# Patient Record
Sex: Male | Born: 1965 | Race: Black or African American | Hispanic: No | Marital: Single | Smoking: Current every day smoker
Health system: Southern US, Community
[De-identification: ages and names within clinical notes are randomized; demographics above are authoritative.]

## PROBLEM LIST (undated history)

## (undated) DIAGNOSIS — I10 Essential (primary) hypertension: Secondary | ICD-10-CM

---

## 2017-07-10 ENCOUNTER — Encounter: Payer: Self-pay | Admitting: Emergency Medicine

## 2017-07-10 ENCOUNTER — Emergency Department
Admission: EM | Admit: 2017-07-10 | Discharge: 2017-07-11 | Disposition: A | Discharge status: Home / Self Care | Payer: Self-pay | Attending: Emergency Medicine | Admitting: Emergency Medicine

## 2017-07-10 DIAGNOSIS — R519 Headache, unspecified: Secondary | ICD-10-CM

## 2017-07-10 DIAGNOSIS — I10 Essential (primary) hypertension: Secondary | ICD-10-CM | POA: Insufficient documentation

## 2017-07-10 DIAGNOSIS — F17210 Nicotine dependence, cigarettes, uncomplicated: Secondary | ICD-10-CM | POA: Insufficient documentation

## 2017-07-10 DIAGNOSIS — R51 Headache: Secondary | ICD-10-CM

## 2017-07-10 HISTORY — DX: Essential (primary) hypertension: I10

## 2017-07-10 NOTE — ED Notes (Signed)
Pt updated on delay by scott, ed tech.

## 2017-07-10 NOTE — ED Notes (Signed)
Report from sonja, rn.  

## 2017-07-10 NOTE — ED Triage Notes (Signed)
Pt arrives ambulatory to triage with c/o headache x 1 1/2 week. Pt states that he has taken multiple Ibuprofen with no relief. Pt denies trauma of any kind. Pt is in NAD.

## 2017-07-11 LAB — BASIC METABOLIC PANEL
Anion gap: 9 (ref 5–15)
BUN: 13 mg/dL (ref 6–20)
CO2: 27 mmol/L (ref 22–32)
CREATININE: 1.26 mg/dL — AB (ref 0.61–1.24)
Calcium: 9.5 mg/dL (ref 8.9–10.3)
Chloride: 103 mmol/L (ref 101–111)
GFR calc Af Amer: >60 mL/min (ref >60)
Glucose, Bld: 99 mg/dL (ref 65–99)
POTASSIUM: 3.1 mmol/L — AB (ref 3.5–5.1)
SODIUM: 139 mmol/L (ref 135–145)

## 2017-07-11 MED ORDER — HYDROCHLOROTHIAZIDE 12.5 MG PO CAPS
12.5000 mg | ORAL_CAPSULE | ORAL | Status: AC
  Administered 2017-07-11: 12.5 mg via ORAL
  Filled 2017-07-11: qty 1

## 2017-07-11 MED ORDER — BUTALBITAL-APAP-CAFFEINE 50-325-40 MG PO TABS
2.0000 | ORAL_TABLET | ORAL | Status: AC
  Administered 2017-07-11: 2 via ORAL
  Filled 2017-07-11: qty 2

## 2017-07-11 MED ORDER — HYDROCHLOROTHIAZIDE 12.5 MG PO CAPS: 12.5000 mg | ORAL_CAPSULE | Freq: Every day | ORAL | 3 refills | Status: DC

## 2017-07-11 NOTE — ED Notes (Signed)
Pt not in room.

## 2017-07-11 NOTE — ED Provider Notes (Signed)
[facility] Emergency Department Provider Note  ____________________________________________   First MD Initiated Contact with Patient 07/10/17 2352     (approximate)  I have reviewed the triage vital signs and the nursing notes.   HISTORY  Chief Complaint Headache    HPI Darren Gibson is a 51 y.o. male Who is generally healthy and active with only a history of hypertension who presents for evaluation of intermittent posterior headache over the last week and a half.  Nothing in particular makes his symptoms better or worse.  He has had no injury or head trauma.  He states that his headaches will come on gradually, lasts for hours or even into the next day, and they gradually get better.  Ibuprofen helped a little bit over the last week but not consistently.  He describes them as moderate.  He had an episode of feeling dizzy or lightheaded earlier today while he was at work and his boss told him to get checked out.  He denies fever/chills, neck pain, nausea, vomiting, chest pain, abdominal pain, dysuria, and changes in urinary habits.  Of note, the patient states that he had headaches like this in the past when he was diagnosed with high blood pressure.  He started on HCTZ and the headaches went away.  He has not been taking medicine for about a year and does not have a primary care doctor.  He did not continue on the medicine because he thought it would be too expensive and was unaware [facility].   Past Medical History:  Diagnosis Date  . Hypertension     There are no active problems to display for this patient.   History reviewed. No pertinent surgical history.  Prior to Admission medications   Medication Sig Start Date End Date Taking? Authorizing Provider  hydrochlorothiazide (MICROZIDE) 12.5 MG capsule Take 1 capsule (12.5 mg total) by mouth daily. 07/11/17   Loleta RoseForbach, Rozetta Stumpp, MD    Allergies Codeine and Percocet  [oxycodone-acetaminophen]  No family history on file.  Social History Social History  Substance Use Topics  . Smoking status: Current Every Day Smoker    Packs/day: 1.00    Types: Cigarettes  . Smokeless tobacco: Never Used  . Alcohol use No    Review of Systems Constitutional: No fever/chills Eyes: No visual changes. ENT: No sore throat. Cardiovascular: Denies chest pain. Respiratory: Denies shortness of breath. Gastrointestinal: No abdominal pain.  No nausea, no vomiting.  No diarrhea.  No constipation. Genitourinary: Negative for dysuria. Musculoskeletal: Negative for neck pain.  Negative for back pain. Integumentary: Negative for rash. Neurological: Intermittent posterior headaches for 1.5+ weeks.  No focal numbness/weakness   ____________________________________________   PHYSICAL EXAM:  VITAL SIGNS: ED Triage Vitals  Enc Vitals Group     BP 07/10/17 2200 (!) 176/100     Pulse Rate 07/10/17 2200 85     Resp 07/10/17 2200 18     Temp 07/10/17 2200 98.2 F (36.8 C)     Temp Source 07/10/17 2200 Oral     SpO2 07/10/17 2200 99 %     Weight 07/10/17 2200 89.8 kg (198 lb)     Height 07/10/17 2200 1.829 m (6')     Head Circumference --      Peak Flow --      Pain Score 07/10/17 2159 6     Pain Loc --      Pain Edu? --      Excl.  in GC? --     Constitutional: Alert and oriented. Well appearing and in no acute distress. Eyes: Conjunctivae are normal. PERRL. EOMI. Head: Atraumatic. Nose: No congestion/rhinnorhea. Mouth/Throat: Mucous membranes are moist. Neck: No stridor.  No meningeal signs.   Cardiovascular: Normal rate, regular rhythm. Good peripheral circulation. Grossly normal heart sounds. Respiratory: Normal respiratory effort.  No retractions. Lungs CTAB. Gastrointestinal: Soft and nontender. No distention.  Musculoskeletal: No lower extremity tenderness nor edema. No gross deformities of extremities. Neurologic:  Normal speech and language. No gross  focal neurologic deficits are appreciated.  Skin:  Skin is warm, dry and intact. No rash noted. Psychiatric: Mood and affect are normal. Speech and behavior are normal.  ____________________________________________   LABS (all labs ordered are listed, but only abnormal results are displayed)  Labs Reviewed  BASIC METABOLIC PANEL - Abnormal; Notable for the following:       Result Value   Potassium 3.1 (*)    Creatinine, Ser 1.26 (*)    All other components within normal limits   ____________________________________________  EKG  None - EKG not ordered by ED physician ____________________________________________  RADIOLOGY   No results found.  ____________________________________________   PROCEDURES  Critical Care performed: No   Procedure(s) performed:   Procedures   ____________________________________________   INITIAL IMPRESSION / ASSESSMENT AND PLAN / ED COURSE  As part of my medical decision making, I reviewed the following data within the electronic MEDICAL RECORD NUMBER Nursing notes reviewed and incorporated    Differential diagnosis includes, but is not limited to, intracranial hemorrhage, meningitis/encephalitis, previous head trauma, cavernous venous thrombosis, tension headache, temporal arteritis, migraine or migraine equivalent, idiopathic intracranial hypertension, and non-specific headache.  however, the patient is very well-appearing and in no acute distress with normal vital signs except for hypertension including a diastolic of about 100.  Given the duration of his symptoms and his reassuring physical exam as well as the overall history, I strongly doubt acute intracranial pathology that would be visible on any sort of imaging.  I discussed with him various options including CT scans, lab work, etc. I also gave him the option of starting back up on his blood pressure medicine.  He would prefer to do that and try some Fioricet.  I will check some  baseline labs but the patient wants to go home and I do not feel he needs to stay for the results.  I am starting him on 12.5 mg of HCTZ daily and encouraged him strongly to follow up with an outpatient doctor and I gave him a phone number to call to try and find a PCP.  I gave my usual and customary return precautions.  He understands and agrees with plan.  Clinical Course as of Jul 11 325  Sat Jul 11, 2017  1610 Lab work notable for a decreased potassium and very slightly increased creatinine but a normal GFR.  Of note, I was told after the fact that the patient eloped after the blood draw and before he could be given his paperwork including his prescription.  [CF]    Clinical Course User Index [CF] Loleta Rose, MD    ____________________________________________  FINAL CLINICAL IMPRESSION(S) / ED DIAGNOSES  Final diagnoses:  Nonintractable episodic headache, unspecified headache type  Essential hypertension     MEDICATIONS GIVEN DURING THIS VISIT:  Medications  hydrochlorothiazide (MICROZIDE) capsule 12.5 mg (12.5 mg Oral Given 07/11/17 0012)  butalbital-acetaminophen-caffeine (FIORICET, ESGIC) 50-325-40 MG per tablet 2 tablet (2 tablets Oral Given 07/11/17  0013)     NEW OUTPATIENT MEDICATIONS STARTED DURING THIS VISIT:  Discharge Medication List as of 07/11/2017 12:27 AM    START taking these medications   Details  hydrochlorothiazide (MICROZIDE) 12.5 MG capsule Take 1 capsule (12.5 mg total) by mouth daily., Starting Sat 07/11/2017, Print        Discharge Medication List as of 07/11/2017 12:27 AM      Discharge Medication List as of 07/11/2017 12:27 AM       Note:  This document was prepared using Dragon voice recognition software and may include unintentional dictation errors.    Loleta Rose, MD 07/11/17 [phone]

## 2017-07-11 NOTE — ED Notes (Signed)
Pt not in room per registration.

## 2017-07-11 NOTE — ED Notes (Signed)
Pt states has had a posterior skull headache for over one week. Pt states he is supposed to take HCTZ for HTN but has not taken. Pt denies nausea, vomiting. Pt appears in no acute distress. Skin normal color warm and dry. resps unlabored. Pt moving all extremities and able to bend neck without difficulty.

## 2017-07-11 NOTE — Discharge Instructions (Signed)
You have been seen in the Emergency Department (ED) for a headache.  Please use Tylenol or Motrin as needed for symptoms, but only as written on the box. Because we think that the headache is related to your blood pressure, encourage you to fill your prescription [facility] at [facility]Z 12.5 mg by mouth and follow up at the number propvided to establish a primary care doctor as soon as possible.  Call your doctor or return to the ED if you have a worsening headache, sudden and severe headache, confusion, slurred speech, facial droop, weakness or numbness in any arm or leg, extreme fatigue, vision problems, or other symptoms that concern you.

## 2017-09-26 ENCOUNTER — Emergency Department: Payer: Self-pay

## 2017-09-26 ENCOUNTER — Encounter: Payer: Self-pay | Admitting: Emergency Medicine

## 2017-09-26 DIAGNOSIS — Z760 Encounter for issue of repeat prescription: Secondary | ICD-10-CM | POA: Insufficient documentation

## 2017-09-26 DIAGNOSIS — R55 Syncope and collapse: Secondary | ICD-10-CM | POA: Insufficient documentation

## 2017-09-26 DIAGNOSIS — F17210 Nicotine dependence, cigarettes, uncomplicated: Secondary | ICD-10-CM | POA: Insufficient documentation

## 2017-09-26 DIAGNOSIS — I10 Essential (primary) hypertension: Secondary | ICD-10-CM | POA: Insufficient documentation

## 2017-09-26 LAB — BASIC METABOLIC PANEL
ANION GAP: 8 (ref 5–15)
BUN: 19 mg/dL (ref 6–20)
CHLORIDE: 105 mmol/L (ref 101–111)
CO2: 27 mmol/L (ref 22–32)
Calcium: 8.9 mg/dL (ref 8.9–10.3)
Creatinine, Ser: 1.34 mg/dL — ABNORMAL HIGH (ref 0.61–1.24)
GFR calc Af Amer: >60 mL/min (ref >60)
GFR, EST NON AFRICAN AMERICAN: 60 mL/min — AB (ref >60)
Glucose, Bld: 81 mg/dL (ref 65–99)
POTASSIUM: 3.5 mmol/L (ref 3.5–5.1)
Sodium: 140 mmol/L (ref 135–145)

## 2017-09-26 LAB — URINALYSIS, COMPLETE (UACMP) WITH MICROSCOPIC
BACTERIA UA: NONE SEEN
Bilirubin Urine: NEGATIVE
Glucose, UA: NEGATIVE mg/dL
Hgb urine dipstick: NEGATIVE
KETONES UR: NEGATIVE mg/dL
Leukocytes, UA: NEGATIVE
NITRITE: NEGATIVE
PROTEIN: NEGATIVE mg/dL
Specific Gravity, Urine: 1.015 (ref 1.005–1.030)
pH: 6 (ref 5.0–8.0)

## 2017-09-26 LAB — CBC
HEMATOCRIT: 51 % (ref 40.0–52.0)
HEMOGLOBIN: 17 g/dL (ref 13.0–18.0)
MCH: 31.8 pg (ref 26.0–34.0)
MCHC: 33.3 g/dL (ref 32.0–36.0)
MCV: 95.3 fL (ref 80.0–100.0)
Platelets: 298 10*3/uL (ref 150–440)
RBC: 5.35 MIL/uL (ref 4.40–5.90)
RDW: 12.8 % (ref 11.5–14.5)
WBC: 14.6 10*3/uL — ABNORMAL HIGH (ref 3.8–10.6)

## 2017-09-26 NOTE — ED Triage Notes (Signed)
Patient states that about 2 hours ago he was at work and started feeling weak, dizzy and short of breath. Patient states that he did donate plasma today.

## 2017-09-27 ENCOUNTER — Emergency Department
Admission: EM | Admit: 2017-09-27 | Discharge: 2017-09-27 | Disposition: A | Discharge status: Home / Self Care | Payer: Self-pay | Attending: Emergency Medicine | Admitting: Emergency Medicine

## 2017-09-27 DIAGNOSIS — I10 Essential (primary) hypertension: Secondary | ICD-10-CM

## 2017-09-27 DIAGNOSIS — Z760 Encounter for issue of repeat prescription: Secondary | ICD-10-CM

## 2017-09-27 DIAGNOSIS — R55 Syncope and collapse: Secondary | ICD-10-CM

## 2017-09-27 LAB — TROPONIN I: Troponin I: <0.03 ng/mL (ref <0.03)

## 2017-09-27 MED ORDER — HYDROCHLOROTHIAZIDE 12.5 MG PO CAPS: 12.5000 mg | ORAL_CAPSULE | Freq: Every day | ORAL | 0 refills | Status: DC

## 2017-09-27 NOTE — ED Provider Notes (Signed)
[facility] Emergency Department Provider Note  ____________________________________________   First MD Initiated Contact with Patient 09/27/17 0149     (approximate)  I have reviewed the triage vital signs and the nursing notes.   HISTORY  Chief Complaint Dizziness; Shortness of Breath; and Weakness   HPI Darren Gibson is a 52 y.o. male who self presents to emergency department with lightheadedness, nausea, warmth, and nearly passing out that happened 2 hours prior to arrival.  His symptoms began while at work when he was standing up.  The past after he laid down.  Several hours prior to this event he donated plasma for 3 hours.  He has no family history of sudden cardiac death.  He had no antecedent chest pain or palpitations.  He did not hit his head.  He feels well now.  His symptoms began suddenly passed quickly.  They are worsened by standing up and improved with rest.  Past Medical History:  Diagnosis Date  . Hypertension     There are no active problems to display for this patient.   History reviewed. No pertinent surgical history.  Prior to Admission medications   Medication Sig Start Date End Date Taking? Authorizing Provider  hydrochlorothiazide (MICROZIDE) 12.5 MG capsule Take 1 capsule (12.5 mg total) by mouth daily. 09/27/17   Merrily Brittle, MD    Allergies Codeine and Percocet [oxycodone-acetaminophen]  No family history on file.  Social History Social History   Tobacco Use  . Smoking status: Current Every Day Smoker    Packs/day: 1.00    Types: Cigarettes  . Smokeless tobacco: Never Used  Substance Use Topics  . Alcohol use: No  . Drug use: Yes    Types: Marijuana    Review of Systems Constitutional: No fever/chills Eyes: No visual changes. ENT: No sore throat. Cardiovascular: Denies chest pain. Respiratory: Denies shortness of breath. Gastrointestinal: No abdominal pain.  Positive for nausea, no vomiting.  No  diarrhea.  No constipation. Genitourinary: Negative for dysuria. Musculoskeletal: Negative for back pain. Skin: Negative for rash. Neurological: Negative for headaches, focal weakness or numbness.   ____________________________________________   PHYSICAL EXAM:  VITAL SIGNS: ED Triage Vitals [09/26/17 2149]  Enc Vitals Group     BP      Pulse      Resp      Temp      Temp src      SpO2      Weight 195 lb (88.5 kg)     Height 6' (1.829 m)     Head Circumference      Peak Flow      Pain Score      Pain Loc      Pain Edu?      Excl. in GC?     Constitutional: Alert and oriented x4 well-appearing nontoxic no diaphoresis speaks full clear sentences Eyes: PERRL EOMI. Head: Atraumatic. Nose: No congestion/rhinnorhea. Mouth/Throat: No trismus Neck: No stridor.   Cardiovascular: Normal rate, regular rhythm. Grossly normal heart sounds.  Good peripheral circulation. Respiratory: Normal respiratory effort.  No retractions. Lungs CTAB and moving good air Gastrointestinal: Soft nontender Musculoskeletal: No lower extremity edema   Neurologic:  Normal speech and language. No gross focal neurologic deficits are appreciated. Skin:  Skin is warm, dry and intact. No rash noted. Psychiatric: Mood and affect are normal. Speech and behavior are normal.    ____________________________________________   DIFFERENTIAL includes but not limited to  Cardiogenic syncope, vasovagal syncope, dehydration ____________________________________________  LABS (all labs ordered are listed, but only abnormal results are displayed)  Labs Reviewed  BASIC METABOLIC PANEL - Abnormal; Notable for the following components:      Result Value   Creatinine, Ser 1.34 (*)    GFR calc non Af Amer 60 (*)    All other components within normal limits  CBC - Abnormal; Notable for the following components:   WBC 14.6 (*)    All other components within normal limits  URINALYSIS, COMPLETE (UACMP) WITH  MICROSCOPIC - Abnormal; Notable for the following components:   Color, Urine YELLOW (*)    APPearance CLEAR (*)    Squamous Epithelial / LPF 0-5 (*)    All other components within normal limits  TROPONIN I    Lab work reviewed by me with no acute disease Elevated white count is nonspecific and likely secondary to stress __________________________________________  EKG  ED ECG REPORT I, Merrily BrittleNeil Morrison Masser, the attending physician, personally viewed and interpreted this ECG.  Date: 09/27/2017 EKG Time:  Rate: 112 Rhythm: Sinus tachycardia QRS Axis: normal Intervals: normal ST/T Wave abnormalities: normal Narrative Interpretation: no evidence of acute ischemia  ____________________________________________  RADIOLOGY  Chest x-ray reviewed by me with no acute disease ____________________________________________   PROCEDURES  Procedure(s) performed: no  Procedures  Critical Care performed: no  Observation: no ____________________________________________   INITIAL IMPRESSION / ASSESSMENT AND PLAN / ED COURSE  Pertinent labs & imaging results that were available during my care of the patient were reviewed by me and considered in my medical decision making (see chart for details).  The patient arrives hemodynamically stable and very well-appearing.  His EKG has no signs concerning for cardiogenic syncope.  He likely had a vasovagal episode secondary to dehydration from donating plasma.  On further questioning the patient does say that he is primarily here to get a work note because he nearly passed out at work.  He is also requesting referral to primary care physician and a refill of his hydrochlorothiazide.  At this point the patient is medically stable for outpatient management verbalizes understanding and agreement with the plan.      ____________________________________________   FINAL CLINICAL IMPRESSION(S) / ED DIAGNOSES  Final diagnoses:  Near syncope    Hypertension, unspecified type  Medication refill      NEW MEDICATIONS STARTED DURING THIS VISIT:  This SmartLink is deprecated. Use AVSMEDLIST instead to display the medication list for a patient.   Note:  This document was prepared using Dragon voice recognition software and may include unintentional dictation errors.     Merrily Brittleifenbark, Cabela Pacifico, MD 09/27/17 [phone]

## 2017-09-27 NOTE — Discharge Instructions (Signed)
Please make sure you remain well-hydrated and restart your home blood pressure medication.  It is critically important that you establish care with a primary care physician this coming week for reevaluation.  Return to the emergency department sooner for any concerns whatsoever.  It was a pleasure to take care of you today, and thank you for coming to our emergency department.  If you have any questions or concerns before leaving please ask the nurse to grab me and I'm more than happy to go through your aftercare instructions again.  If you were prescribed any opioid pain medication today such as Norco, Vicodin, Percocet, morphine, hydrocodone, or oxycodone please make sure you do not drive when you are taking this medication as it can alter your ability to drive safely.  If you have any concerns once you are home that you are not improving or are in fact getting worse before you can make it to your follow-up appointment, please do not hesitate to call 911 and come back for further evaluation.  Merrily BrittleNeil Adalid Beckmann, MD  Results for orders placed or performed during the hospital encounter of 09/27/17  Basic metabolic panel  Result Value Ref Range   Sodium 140 135 - 145 mmol/L   Potassium 3.5 3.5 - 5.1 mmol/L   Chloride 105 101 - 111 mmol/L   CO2 27 22 - 32 mmol/L   Glucose, Bld 81 65 - 99 mg/dL   BUN 19 6 - 20 mg/dL   Creatinine, Ser 5.781.34 (H) 0.61 - 1.24 mg/dL   Calcium 8.9 8.9 - 46.910.3 mg/dL   GFR calc non Af Amer 60 (L) >60 mL/min   GFR calc Af Amer >60 >60 mL/min   Anion gap 8 5 - 15  CBC  Result Value Ref Range   WBC 14.6 (H) 3.8 - 10.6 K/uL   RBC 5.35 4.40 - 5.90 MIL/uL   Hemoglobin 17.0 13.0 - 18.0 g/dL   HCT 62.951.0 52.840.0 - 41.352.0 %   MCV 95.3 80.0 - 100.0 fL   MCH 31.8 26.0 - 34.0 pg   MCHC 33.3 32.0 - 36.0 g/dL   RDW 24.412.8 01.011.5 - 27.214.5 %   Platelets 298 150 - 440 K/uL  Urinalysis, Complete w Microscopic  Result Value Ref Range   Color, Urine YELLOW (A) YELLOW   APPearance CLEAR (A)  CLEAR   Specific Gravity, Urine 1.015 1.005 - 1.030   pH 6.0 5.0 - 8.0   Glucose, UA NEGATIVE NEGATIVE mg/dL   Hgb urine dipstick NEGATIVE NEGATIVE   Bilirubin Urine NEGATIVE NEGATIVE   Ketones, ur NEGATIVE NEGATIVE mg/dL   Protein, ur NEGATIVE NEGATIVE mg/dL   Nitrite NEGATIVE NEGATIVE   Leukocytes, UA NEGATIVE NEGATIVE   RBC / HPF 0-5 0 - 5 RBC/hpf   WBC, UA 0-5 0 - 5 WBC/hpf   Bacteria, UA NONE SEEN NONE SEEN   Squamous Epithelial / LPF 0-5 (A) NONE SEEN   Mucus PRESENT    Hyaline Casts, UA PRESENT   Troponin I  Result Value Ref Range   Troponin I <0.03 <0.03 ng/mL   Dg Chest 2 View  Result Date: 09/26/2017 CLINICAL DATA:  52 y/o M; weakness, dizziness, and shortness of breath EXAM: CHEST  2 VIEW COMPARISON:  None. FINDINGS: The heart size and mediastinal contours are within normal limits. Both lungs are clear. The visualized skeletal structures are unremarkable. IMPRESSION: No active cardiopulmonary disease. Electronically Signed   By: Mitzi HansenLance  Furusawa-Stratton M.D.   On: 09/26/2017 22:50

## 2017-12-15 ENCOUNTER — Encounter: Payer: Self-pay | Admitting: Emergency Medicine

## 2017-12-15 ENCOUNTER — Emergency Department: Payer: Self-pay

## 2017-12-15 ENCOUNTER — Other Ambulatory Visit: Payer: Self-pay

## 2017-12-15 ENCOUNTER — Emergency Department
Admission: EM | Admit: 2017-12-15 | Discharge: 2017-12-15 | Disposition: A | Discharge status: Home / Self Care | Payer: Self-pay | Attending: Emergency Medicine | Admitting: Emergency Medicine

## 2017-12-15 DIAGNOSIS — E860 Dehydration: Secondary | ICD-10-CM | POA: Insufficient documentation

## 2017-12-15 DIAGNOSIS — B349 Viral infection, unspecified: Secondary | ICD-10-CM | POA: Insufficient documentation

## 2017-12-15 DIAGNOSIS — F17210 Nicotine dependence, cigarettes, uncomplicated: Secondary | ICD-10-CM | POA: Insufficient documentation

## 2017-12-15 DIAGNOSIS — I10 Essential (primary) hypertension: Secondary | ICD-10-CM | POA: Insufficient documentation

## 2017-12-15 LAB — BASIC METABOLIC PANEL
Anion gap: 9 (ref 5–15)
BUN: 15 mg/dL (ref 6–20)
CALCIUM: 9 mg/dL (ref 8.9–10.3)
CO2: 28 mmol/L (ref 22–32)
CREATININE: 1.05 mg/dL (ref 0.61–1.24)
Chloride: 103 mmol/L (ref 101–111)
GFR calc Af Amer: >60 mL/min (ref >60)
GLUCOSE: 111 mg/dL — AB (ref 65–99)
Potassium: 3.3 mmol/L — ABNORMAL LOW (ref 3.5–5.1)
Sodium: 140 mmol/L (ref 135–145)

## 2017-12-15 LAB — CBC
HCT: 46 % (ref 40.0–52.0)
Hemoglobin: 15.3 g/dL (ref 13.0–18.0)
MCH: 31.3 pg (ref 26.0–34.0)
MCHC: 33.2 g/dL (ref 32.0–36.0)
MCV: 94.3 fL (ref 80.0–100.0)
PLATELETS: 322 10*3/uL (ref 150–440)
RBC: 4.88 MIL/uL (ref 4.40–5.90)
RDW: 12.7 % (ref 11.5–14.5)
WBC: 12.7 10*3/uL — ABNORMAL HIGH (ref 3.8–10.6)

## 2017-12-15 LAB — URINALYSIS, COMPLETE (UACMP) WITH MICROSCOPIC
Bacteria, UA: NONE SEEN
Bilirubin Urine: NEGATIVE
GLUCOSE, UA: NEGATIVE mg/dL
Hgb urine dipstick: NEGATIVE
Ketones, ur: NEGATIVE mg/dL
Leukocytes, UA: NEGATIVE
NITRITE: NEGATIVE
PROTEIN: 30 mg/dL — AB
SPECIFIC GRAVITY, URINE: 1.024 (ref 1.005–1.030)
Squamous Epithelial / LPF: NONE SEEN
pH: 6 (ref 5.0–8.0)

## 2017-12-15 LAB — TROPONIN I: Troponin I: <0.03 ng/mL (ref <0.03)

## 2017-12-15 MED ORDER — ONDANSETRON 4 MG PO TBDP: 4.0000 mg | ORAL_TABLET | Freq: Three times a day (TID) | ORAL | 0 refills | Status: DC | PRN

## 2017-12-15 MED ORDER — HYDROCHLOROTHIAZIDE 12.5 MG PO TABS: 12.5000 mg | ORAL_TABLET | Freq: Every day | ORAL | 1 refills | Status: DC

## 2017-12-15 MED ORDER — SODIUM CHLORIDE 0.9 % IV BOLUS
1000.0000 mL | Freq: Once | INTRAVENOUS | Status: AC
  Administered 2017-12-15: 1000 mL via INTRAVENOUS

## 2017-12-15 MED ORDER — ACETAMINOPHEN 500 MG PO TABS
1000.0000 mg | ORAL_TABLET | Freq: Once | ORAL | Status: AC
  Administered 2017-12-15: 1000 mg via ORAL
  Filled 2017-12-15: qty 2

## 2017-12-15 MED ORDER — ONDANSETRON HCL 4 MG/2ML IJ SOLN
4.0000 mg | Freq: Once | INTRAMUSCULAR | Status: AC
  Administered 2017-12-15: 4 mg via INTRAVENOUS
  Filled 2017-12-15: qty 2

## 2017-12-15 MED ORDER — HYDROCHLOROTHIAZIDE 12.5 MG PO CAPS
12.5000 mg | ORAL_CAPSULE | Freq: Every day | ORAL | Status: DC
  Administered 2017-12-15: 12.5 mg via ORAL
  Filled 2017-12-15: qty 1

## 2017-12-15 NOTE — ED Provider Notes (Signed)
[facility] Emergency Department Provider Note  ____________________________________________  Time seen: Approximately 2:02 PM  I have reviewed the triage vital signs and the nursing notes.   HISTORY  Chief Complaint Dizziness   HPI Darren Gibson is a 52 y.o. male the history of hypertension who presents for evaluation of dizziness, nausea and vomiting.  Patient reports that he woke up this morning feeling lightheaded. Lightheadedness has been constant and mild in intensity. Has had 2 episodes of nonbloody nonbilious emesis.  Reports a dry cough for the last few days.  Has had chills since this morning but no fever.  No diarrhea or abdominal pain.  No chest pain, shortness of breath, abdominal pain.  No body aches, no sore throat, no headache, no neck stiffness.  Patient denies vertigo.  He was concerned that his symptoms were due to elevated blood pressure since he ran out of his blood pressure medication 2 weeks ago.  He is a smoker but denies history of COPD.  Past Medical History:  Diagnosis Date  . Hypertension     There are no active problems to display for this patient.   History reviewed. No pertinent surgical history.  Prior to Admission medications   Medication Sig Start Date End Date Taking? Authorizing Provider  hydrochlorothiazide (HYDRODIURIL) 12.5 MG tablet Take 1 tablet (12.5 mg total) by mouth daily. 12/15/17   Nita Sickle, MD  ondansetron (ZOFRAN ODT) 4 MG disintegrating tablet Take 1 tablet (4 mg total) by mouth every 8 (eight) hours as needed for nausea or vomiting. 12/15/17   Nita Sickle, MD    Allergies Codeine and Percocet [oxycodone-acetaminophen]  No family history on file.  Social History Social History   Tobacco Use  . Smoking status: Current Every Day Smoker    Packs/day: 1.00    Types: Cigarettes  . Smokeless tobacco: Never Used  Substance Use Topics  . Alcohol use: No  . Drug use: Yes    Types:  Marijuana    Review of Systems  Constitutional: Negative for fever. + chills and lightheadedness Eyes: Negative for visual changes. ENT: Negative for sore throat. Neck: No neck pain  Cardiovascular: Negative for chest pain. Respiratory: Negative for shortness of breath. + cough Gastrointestinal: Negative for abdominal pain,  Diarrhea. + N/V Genitourinary: Negative for dysuria. Musculoskeletal: Negative for back pain. Skin: Negative for rash. Neurological: Negative for headaches, weakness or numbness. Psych: No SI or HI  ____________________________________________   PHYSICAL EXAM:  VITAL SIGNS: ED Triage Vitals  Enc Vitals Group     BP 12/15/17 0938 (!) 148/90     Pulse Rate 12/15/17 0938 (!) 101     Resp 12/15/17 0938 20     Temp 12/15/17 0938 98.3 F (36.8 C)     Temp Source 12/15/17 0938 Oral     SpO2 12/15/17 0938 100 %     Weight 12/15/17 0934 195 lb (88.5 kg)     Height 12/15/17 1345 6' (1.829 m)     Head Circumference --      Peak Flow --      Pain Score 12/15/17 0934 0     Pain Loc --      Pain Edu? --      Excl. in GC? --     Constitutional: Alert and oriented. Well appearing and in no apparent distress. HEENT:      Head: Normocephalic and atraumatic.         Eyes: Conjunctivae are normal. Sclera is non-icteric.  Mouth/Throat: Mucous membranes are moist.       Neck: Supple with no signs of meningismus. Cardiovascular: Tachycardic rate and regular rhythm. No murmurs, gallops, or rubs. 2+ symmetrical distal pulses are present in all extremities. No JVD. Respiratory: Normal respiratory effort. Lungs are clear to auscultation bilaterally. No wheezes, crackles, or rhonchi.  Gastrointestinal: Soft, non tender, and non distended with positive bowel sounds. No rebound or guarding. Musculoskeletal: Nontender with normal range of motion in all extremities. No edema, cyanosis, or erythema of extremities. Neurologic: Normal speech and language. Face is  symmetric. Moving all extremities. No gross focal neurologic deficits are appreciated. Skin: Skin is warm, dry and intact. No rash noted. Psychiatric: Mood and affect are normal. Speech and behavior are normal.  ____________________________________________   LABS (all labs ordered are listed, but only abnormal results are displayed)  Labs Reviewed  BASIC METABOLIC PANEL - Abnormal; Notable for the following components:      Result Value   Potassium 3.3 (*)    Glucose, Bld 111 (*)    All other components within normal limits  CBC - Abnormal; Notable for the following components:   WBC 12.7 (*)    All other components within normal limits  URINALYSIS, COMPLETE (UACMP) WITH MICROSCOPIC - Abnormal; Notable for the following components:   Color, Urine YELLOW (*)    APPearance CLEAR (*)    Protein, ur 30 (*)    All other components within normal limits  TROPONIN I   ____________________________________________  EKG  ED ECG REPORT I, Nita Sicklearolina Kaylina Cahue, the attending physician, personally viewed and interpreted this ECG.  Sinus tachycardia, rate of 101, normal intervals, normal axis, no ST elevations or depressions, T wave flattening in 1 and aVL and Q waves in lead III.  No significant changes when compared to prior from 1/19 ____________________________________________  RADIOLOGY  I have personally reviewed the images performed during this visit and I agree with the Radiologist's read.   Interpretation by Radiologist:  Dg Chest 2 View  Result Date: 12/15/2017 CLINICAL DATA:  Dizziness. EXAM: CHEST - 2 VIEW COMPARISON:  Radiographs of September 26, 2017. FINDINGS: The heart size and mediastinal contours are within normal limits. Both lungs are clear. No pneumothorax or pleural effusion is noted. The visualized skeletal structures are unremarkable. IMPRESSION: No active cardiopulmonary disease. Electronically Signed   By: Lupita RaiderJames  Green Jr, M.D.   On: 12/15/2017 14:36        ____________________________________________   PROCEDURES  Procedure(s) performed: None Procedures Critical Care performed:  None ____________________________________________   INITIAL IMPRESSION / ASSESSMENT AND PLAN / ED COURSE  52 y.o. male the history of hypertension who presents for evaluation of dizziness, nausea, vomiting, cough, and chills since this am.  Patient is well-appearing, no distress, has a pulse of 101 and a low-grade temp of 26F normal work of breathing, clear lungs, soft abdomen, clear oropharynx..  EKG showing sinus tachycardia which is unchanged from patient's baseline but no evidence of ischemia.  Presentation concerning for viral URI symptoms.  Will check orthostatics.  Will restart patient on his hydrochlorothiazide since his blood pressure is elevated.  Will check labs for evidence of sepsis, and organ damage, or cardiac ischemia.  Will do a chest x-ray to rule out pneumonia.  Will give IV fluids, Zofran, Tylenol and PO challenge    _________________________ 6:16 PM on 12/15/2017 -----------------------------------------  Patient initially orthostatic.  Received 1 L of IV fluids with improvement of his blood pressure however continued to be tachycardic worse with  standing.  His dizziness resolved.  His repeat temperature was 99 F.  At that time patient was given a second liter of fluids and Tylenol. HR remained persistent elevated with normo BP and resolution of dizziness. No further episodes of emesis, patient tolerating PO in the Hospital. Labs and CXR were negative for acute findings. Patient with no CP, no SOB, no hypoxia, no tachypnea, no clinical evidence of PE. Presentation concerning with viral illness. Recommended further monitoring in the ED of HR once temp improved after tylenol but patient requested to be dc since he is feeling better. Recommended return if he develops CP, SOB, or the dizziness recurs. He was given Rx for zofran and his HCTZ. He is being  referred to [facility] clinic for follow up.    As part of my medical decision making, I reviewed the following data within the electronic MEDICAL RECORD NUMBER Nursing notes reviewed and incorporated, Labs reviewed , EKG interpreted , Old EKG reviewed, Old chart reviewed, Radiograph reviewed , Notes from prior ED visits and [place] Controlled Substance Database    Pertinent labs & imaging results that were available during my care of the patient were reviewed by me and considered in my medical decision making (see chart for details).    ____________________________________________   FINAL CLINICAL IMPRESSION(S) / ED DIAGNOSES  Final diagnoses:  Viral illness  Dehydration      NEW MEDICATIONS STARTED DURING THIS VISIT:  ED Discharge Orders        Ordered    ondansetron (ZOFRAN ODT) 4 MG disintegrating tablet  Every 8 hours PRN     12/15/17 1816    hydrochlorothiazide (HYDRODIURIL) 12.5 MG tablet  Daily     12/15/17 1816       Note:  This document was prepared using Dragon voice recognition software and may include unintentional dictation errors.    Don Perking, [place], MD 12/15/17 [phone]

## 2017-12-15 NOTE — ED Notes (Signed)
NAD noted at time of D/C. Pt denies questions or concerns. Pt ambulatory to the lobby at this time. MD aware of patient's VS at time of D/C, states okay for D/C.

## 2017-12-15 NOTE — ED Notes (Signed)
This RN to bedside at this time. Pt resting in bed. This RN instructed patient that upon completion of his fluids, we would recheck his orthostatics and treatment plan would vary depending on his BP. Pt states understanding, instructed patient to call out at 1705, pt states understanding.

## 2017-12-15 NOTE — ED Notes (Signed)
Pt given meal tray at this time. Pt visualized in NAD. Will continue to monitor for further patient needs.

## 2017-12-15 NOTE — ED Notes (Signed)
Pt up to the bathroom with assistance from [place], EDT. Pt tolerated well, back to bed without incident.

## 2017-12-15 NOTE — ED Triage Notes (Signed)
Pt to ed with c/o dizziness that started this am while he was at work.  Pt states "I am concerned it may be my blood pressure because I have been out of meds for 2 weeks"  Pt denies chest pain, denies sob but states he feels like he might pass out.  Skin warm and dry. Pt alert and oriented at this time.

## 2018-01-15 ENCOUNTER — Emergency Department: Payer: 59

## 2018-01-15 ENCOUNTER — Other Ambulatory Visit: Payer: Self-pay

## 2018-01-15 ENCOUNTER — Encounter: Payer: Self-pay | Admitting: *Deleted

## 2018-01-15 ENCOUNTER — Emergency Department
Admission: EM | Admit: 2018-01-15 | Discharge: 2018-01-15 | Disposition: A | Discharge status: Home / Self Care | Payer: 59 | Attending: Student in an Organized Health Care Education/Training Program | Admitting: Student in an Organized Health Care Education/Training Program

## 2018-01-15 DIAGNOSIS — F17210 Nicotine dependence, cigarettes, uncomplicated: Secondary | ICD-10-CM | POA: Insufficient documentation

## 2018-01-15 DIAGNOSIS — R1032 Left lower quadrant pain: Secondary | ICD-10-CM | POA: Insufficient documentation

## 2018-01-15 DIAGNOSIS — I10 Essential (primary) hypertension: Secondary | ICD-10-CM | POA: Diagnosis not present

## 2018-01-15 DIAGNOSIS — R109 Unspecified abdominal pain: Secondary | ICD-10-CM

## 2018-01-15 LAB — CBC WITH DIFFERENTIAL/PLATELET
BASOS PCT: 0 %
Basophils Absolute: 0 10*3/uL (ref 0–0.1)
EOS ABS: 0 10*3/uL (ref 0–0.7)
EOS PCT: 0 %
HCT: 41.7 % (ref 40.0–52.0)
HEMOGLOBIN: 14.4 g/dL (ref 13.0–18.0)
Lymphocytes Relative: 11 %
Lymphs Abs: 1.8 10*3/uL (ref 1.0–3.6)
MCH: 32.7 pg (ref 26.0–34.0)
MCHC: 34.4 g/dL (ref 32.0–36.0)
MCV: 95.1 fL (ref 80.0–100.0)
Monocytes Absolute: 0.9 10*3/uL (ref 0.2–1.0)
Monocytes Relative: 6 %
NEUTROS PCT: 83 %
Neutro Abs: 13.6 10*3/uL — ABNORMAL HIGH (ref 1.4–6.5)
PLATELETS: 254 10*3/uL (ref 150–440)
RBC: 4.39 MIL/uL — AB (ref 4.40–5.90)
RDW: 12.8 % (ref 11.5–14.5)
WBC: 16.3 10*3/uL — AB (ref 3.8–10.6)

## 2018-01-15 LAB — URINALYSIS, COMPLETE (UACMP) WITH MICROSCOPIC
BILIRUBIN URINE: NEGATIVE
Glucose, UA: 50 mg/dL — AB
KETONES UR: NEGATIVE mg/dL
LEUKOCYTES UA: NEGATIVE
Nitrite: NEGATIVE
PROTEIN: 30 mg/dL — AB
Specific Gravity, Urine: 1.026 (ref 1.005–1.030)
pH: 5 (ref 5.0–8.0)

## 2018-01-15 LAB — BASIC METABOLIC PANEL
Anion gap: 6 (ref 5–15)
BUN: 18 mg/dL (ref 6–20)
CHLORIDE: 101 mmol/L (ref 101–111)
CO2: 31 mmol/L (ref 22–32)
CREATININE: 1.1 mg/dL (ref 0.61–1.24)
Calcium: 9.2 mg/dL (ref 8.9–10.3)
Glucose, Bld: 118 mg/dL — ABNORMAL HIGH (ref 65–99)
POTASSIUM: 3.4 mmol/L — AB (ref 3.5–5.1)
SODIUM: 138 mmol/L (ref 135–145)

## 2018-01-15 MED ORDER — TRAMADOL HCL 50 MG PO TABS
50.0000 mg | ORAL_TABLET | Freq: Four times a day (QID) | ORAL | 0 refills | Status: AC | PRN
Start: 2018-01-15 — End: 2019-01-15

## 2018-01-15 MED ORDER — CEPHALEXIN 500 MG PO CAPS
500.0000 mg | ORAL_CAPSULE | Freq: Once | ORAL | Status: AC
  Administered 2018-01-15: 500 mg via ORAL
  Filled 2018-01-15: qty 1

## 2018-01-15 MED ORDER — KETOROLAC TROMETHAMINE 30 MG/ML IJ SOLN
15.0000 mg | Freq: Once | INTRAMUSCULAR | Status: AC
  Administered 2018-01-15: 15 mg via INTRAMUSCULAR
  Filled 2018-01-15: qty 1

## 2018-01-15 MED ORDER — CEPHALEXIN 500 MG PO CAPS: 500.0000 mg | ORAL_CAPSULE | Freq: Three times a day (TID) | ORAL | 0 refills | Status: AC

## 2018-01-15 MED ORDER — TRAMADOL HCL 50 MG PO TABS
50.0000 mg | ORAL_TABLET | Freq: Four times a day (QID) | ORAL | Status: DC
  Administered 2018-01-15: 50 mg via ORAL
  Filled 2018-01-15: qty 1

## 2018-01-15 NOTE — ED Notes (Signed)
Patient transported to CT 

## 2018-01-15 NOTE — ED Provider Notes (Signed)
[facility] Emergency Department Provider Note    First MD Initiated Contact with Patient 01/15/18 1426     (approximate)  I have reviewed the triage vital signs and the nursing notes.   HISTORY  Chief Complaint Flank Pain    HPI Darren Gibson is a 52 y.o. male with a history of hypertension presents with chief complaint of left flank pain.  Patient has never had similar symptoms in the past.  This is been ongoing since last Tuesday.  First noticed it when he was washing his car.  States it caused him to double over and had to go to the ground because of his severe pain states that the pain starts in his left flank and will wrap around the left groin.  Denies any trauma.  Says he has noted darker urine and was [facility] that he had trace blood in his urine.  States the pain worsened last night to the point where he was tossing and turning and could not sleep to the pain.  Currently rates as mild to moderate.  Past Medical History:  Diagnosis Date  . Hypertension    History reviewed. No pertinent family history. History reviewed. No pertinent surgical history. There are no active problems to display for this patient.     Prior to Admission medications   Medication Sig Start Date End Date Taking? Authorizing Provider  cephALEXin (KEFLEX) 500 MG capsule Take 1 capsule (500 mg total) by mouth 3 (three) times daily for 7 days. 01/15/18 01/22/18  Willy Eddyobinson, [place], MD  hydrochlorothiazide (HYDRODIURIL) 12.5 MG tablet Take 1 tablet (12.5 mg total) by mouth daily. 12/15/17   Nita SickleVeronese, [place], MD  ondansetron (ZOFRAN ODT) 4 MG disintegrating tablet Take 1 tablet (4 mg total) by mouth every 8 (eight) hours as needed for nausea or vomiting. 12/15/17   Don PerkingVeronese, [place], MD  traMADol (ULTRAM) 50 MG tablet Take 1 tablet (50 mg total) by mouth every 6 (six) hours as needed. 01/15/18 01/15/19  Willy Eddyobinson, [place], MD    Allergies Codeine and Percocet  [oxycodone-acetaminophen]    Social History Social History   Tobacco Use  . Smoking status: Current Every Day Smoker    Packs/day: 1.00    Types: Cigarettes  . Smokeless tobacco: Never Used  Substance Use Topics  . Alcohol use: No  . Drug use: Yes    Types: Marijuana    Review of Systems Patient denies headaches, rhinorrhea, blurry vision, numbness, shortness of breath, chest pain, edema, cough, abdominal pain, nausea, vomiting, diarrhea, dysuria, fevers, rashes or hallucinations unless otherwise stated above in HPI. ____________________________________________   PHYSICAL EXAM:  VITAL SIGNS: Vitals:   01/15/18 1212  BP: (!) 142/73  Pulse: 92  Resp: 16  Temp: 98.3 F (36.8 C)  SpO2: 98%    Constitutional: Alert and oriented. Well appearing and in no acute distress. Eyes: Conjunctivae are normal.  Head: Atraumatic. Nose: No congestion/rhinnorhea. Mouth/Throat: Mucous membranes are moist.   Neck: No stridor. Painless ROM.  Cardiovascular: Normal rate, regular rhythm. Grossly normal heart sounds.  Good peripheral circulation. Respiratory: Normal respiratory effort.  No retractions. Lungs CTAB. Gastrointestinal: Soft and nontender. No distention. No abdominal bruits. + left CVA tenderness. Genitourinary:  Musculoskeletal: No lower extremity tenderness nor edema.  No joint effusions. Neurologic:  Normal speech and language. No gross focal neurologic deficits are appreciated. No facial droop Skin:  Skin is warm, dry and intact. No rash noted. Psychiatric: Mood and affect are normal. Speech  and behavior are normal.  ____________________________________________   LABS (all labs ordered are listed, but only abnormal results are displayed)  Results for orders placed or performed during the hospital encounter of 01/15/18 (from the past 24 hour(s))  Urinalysis, Complete w Microscopic     Status: Abnormal   Collection Time: 01/15/18 12:18 PM  Result Value Ref Range    Color, Urine YELLOW (A) YELLOW   APPearance CLEAR (A) CLEAR   Specific Gravity, Urine 1.026 1.005 - 1.030   pH 5.0 5.0 - 8.0   Glucose, UA 50 (A) NEGATIVE mg/dL   Hgb urine dipstick SMALL (A) NEGATIVE   Bilirubin Urine NEGATIVE NEGATIVE   Ketones, ur NEGATIVE NEGATIVE mg/dL   Protein, ur 30 (A) NEGATIVE mg/dL   Nitrite NEGATIVE NEGATIVE   Leukocytes, UA NEGATIVE NEGATIVE   Squamous Epithelial / LPF 0-5 0 - 5   WBC, UA 0-5 0 - 5 WBC/hpf   RBC / HPF 0-5 0 - 5 RBC/hpf   Bacteria, UA RARE (A) NONE SEEN   Ca Oxalate Crys, UA PRESENT   CBC with Differential/Platelet     Status: Abnormal   Collection Time: 01/15/18  3:03 PM  Result Value Ref Range   WBC 16.3 (H) 3.8 - 10.6 K/uL   RBC 4.39 (L) 4.40 - 5.90 MIL/uL   Hemoglobin 14.4 13.0 - 18.0 g/dL   HCT 69.6 29.5 - 28.4 %   MCV 95.1 80.0 - 100.0 fL   MCH 32.7 26.0 - 34.0 pg   MCHC 34.4 32.0 - 36.0 g/dL   RDW 13.2 44.0 - 10.2 %   Platelets 254 150 - 440 K/uL   Neutrophils Relative % 83 %   Neutro Abs 13.6 (H) 1.4 - 6.5 K/uL   Lymphocytes Relative 11 %   Lymphs Abs 1.8 1.0 - 3.6 K/uL   Monocytes Relative 6 %   Monocytes Absolute 0.9 0.2 - 1.0 K/uL   Eosinophils Relative 0 %   Eosinophils Absolute 0.0 0 - 0.7 K/uL   Basophils Relative 0 %   Basophils Absolute 0.0 0 - 0.1 K/uL  Basic metabolic panel     Status: Abnormal   Collection Time: 01/15/18  3:03 PM  Result Value Ref Range   Sodium 138 135 - 145 mmol/L   Potassium 3.4 (L) 3.5 - 5.1 mmol/L   Chloride 101 101 - 111 mmol/L   CO2 31 22 - 32 mmol/L   Glucose, Bld 118 (H) 65 - 99 mg/dL   BUN 18 6 - 20 mg/dL   Creatinine, Ser 7.25 0.61 - 1.24 mg/dL   Calcium 9.2 8.9 - 36.6 mg/dL   GFR calc non Af Amer >60 >60 mL/min   GFR calc Af Amer >60 >60 mL/min   Anion gap 6 5 - 15   ____________________________________________ ___________________________________________  RADIOLOGY  I personally reviewed all radiographic images ordered to evaluate for the above acute complaints and  reviewed radiology reports and findings.  These findings were personally discussed with the patient.  Please see medical record for radiology report.  ____________________________________________   PROCEDURES  Procedure(s) performed:  Procedures    Critical Care performed: no ____________________________________________   INITIAL IMPRESSION / ASSESSMENT AND PLAN / ED COURSE  Pertinent labs & imaging results that were available during my care of the patient were reviewed by me and considered in my medical decision making (see chart for details).  DDX: Pyelonephritis, msk strain, kidney stone, colitis, radiculopathy, shingles, AAA   Darren Gibson is a 52 y.o. who  presents to the ED with p/w right flank pain. No fevers, no systemic symptoms. + urinary symptoms. Denies trauma or injury. Afebrile in ED. Exam as above. Flank TTP, otherwise abdominal exam is benign. No peritoneal signs. Possible kidney stone, cystitis, or pyelonephritis.  UA with rare bacteria and + oxalate crystal. CT Stone with no stone, hydro or explanation for patient's presentation Clinical picture is not consistent with appendicitis, diverticulitis, pancreatitis, cholecystitis, bowel perforation, aortic dissection, splenic injury or acute abdominal process at this time.   Clinical Course as of Jan 16 1608  Fri Jan 15, 2018  1539 Hgb urine dipstick(!): SMALL [PR]  1552 Patient reassessed.  Currently pain-free.  Symptoms significantly improved CT shows no evidence of hydro-or acute intra-abdominal process.  Patient without any history of IV drug abuse.  Repeat neuro exam is nonfocal.  It is not clinically consistent with abscess, AAA, radiculopathy or other acute intradermal process requiring admission to the hospital.  Will give Keflex and trial of outpatient follow management.  Have discussed with the patient and available family all diagnostics and treatments performed thus far and all questions were answered to the  best of my ability. The patient demonstrates understanding and agreement with plan.    [PR]    Clinical Course User Index [PR] Willy Eddy, MD     As part of my medical decision making, I reviewed the following data within the electronic MEDICAL RECORD NUMBER Nursing notes reviewed and incorporated, Labs reviewed, notes from prior ED visits.   ____________________________________________   FINAL CLINICAL IMPRESSION(S) / ED DIAGNOSES  Final diagnoses:  Left flank pain      NEW MEDICATIONS STARTED DURING THIS VISIT:  New Prescriptions   CEPHALEXIN (KEFLEX) 500 MG CAPSULE    Take 1 capsule (500 mg total) by mouth 3 (three) times daily for 7 days.   TRAMADOL (ULTRAM) 50 MG TABLET    Take 1 tablet (50 mg total) by mouth every 6 (six) hours as needed.     Note:  This document was prepared using Dragon voice recognition software and may include unintentional dictation errors.    Willy Eddy, MD 01/15/18 [phone]

## 2018-01-15 NOTE — Discharge Instructions (Signed)

## 2018-01-15 NOTE — ED Triage Notes (Addendum)
Pt to ED reporting left sided flank pain that radiates into groin and left thigh. Pt reports he had a similar pain since Sunday of last week and [facility].Urine was reported to have blood in it but no reports or UTI or other kidney problems. No injury to the area and no reports of sciatica. PT reports the pain "feels like it is way on the inside not in the muscle" No neuro deficits noted. No changes in left leg or foot.

## 2018-06-15 ENCOUNTER — Emergency Department
Admission: EM | Admit: 2018-06-15 | Discharge: 2018-06-15 | Disposition: A | Discharge status: Home / Self Care | Payer: 59 | Attending: Emergency Medicine | Admitting: Emergency Medicine

## 2018-06-15 ENCOUNTER — Emergency Department: Payer: 59

## 2018-06-15 ENCOUNTER — Other Ambulatory Visit: Payer: Self-pay

## 2018-06-15 DIAGNOSIS — I10 Essential (primary) hypertension: Secondary | ICD-10-CM | POA: Insufficient documentation

## 2018-06-15 DIAGNOSIS — R079 Chest pain, unspecified: Secondary | ICD-10-CM | POA: Insufficient documentation

## 2018-06-15 DIAGNOSIS — F17210 Nicotine dependence, cigarettes, uncomplicated: Secondary | ICD-10-CM | POA: Insufficient documentation

## 2018-06-15 DIAGNOSIS — R42 Dizziness and giddiness: Secondary | ICD-10-CM

## 2018-06-15 LAB — CBC
HEMATOCRIT: 41.4 % (ref 40.0–52.0)
HEMOGLOBIN: 14.3 g/dL (ref 13.0–18.0)
MCH: 33 pg (ref 26.0–34.0)
MCHC: 34.6 g/dL (ref 32.0–36.0)
MCV: 95.4 fL (ref 80.0–100.0)
Platelets: 274 10*3/uL (ref 150–440)
RBC: 4.33 MIL/uL — ABNORMAL LOW (ref 4.40–5.90)
RDW: 12.6 % (ref 11.5–14.5)
WBC: 9.9 10*3/uL (ref 3.8–10.6)

## 2018-06-15 LAB — TROPONIN I
Troponin I: <0.03 ng/mL (ref <0.03)
Troponin I: <0.03 ng/mL (ref <0.03)

## 2018-06-15 LAB — BASIC METABOLIC PANEL
Anion gap: 7 (ref 5–15)
BUN: 12 mg/dL (ref 6–20)
CHLORIDE: 101 mmol/L (ref 98–111)
CO2: 32 mmol/L (ref 22–32)
Calcium: 9 mg/dL (ref 8.9–10.3)
Creatinine, Ser: 1.07 mg/dL (ref 0.61–1.24)
GFR calc Af Amer: >60 mL/min (ref >60)
GLUCOSE: 99 mg/dL (ref 70–99)
POTASSIUM: 3.5 mmol/L (ref 3.5–5.1)
Sodium: 140 mmol/L (ref 135–145)

## 2018-06-15 MED ORDER — SODIUM CHLORIDE 0.9 % IV SOLN
Freq: Once | INTRAVENOUS | Status: AC
  Administered 2018-06-15: 14:00:00 via INTRAVENOUS

## 2018-06-15 NOTE — ED Notes (Signed)
Patient transported to X-ray 

## 2018-06-15 NOTE — ED Provider Notes (Signed)
[facility] Emergency Department Provider Note       Time seen: ----------------------------------------- 1:36 PM on 06/15/2018 -----------------------------------------   I have reviewed the triage vital signs and the nursing notes.  HISTORY   Chief Complaint Chest Pain    HPI Darren Gibson is a 52 y.o. male with a history of hypertension who presents to the ED for chest pain and tightness with intermittent dizziness since last night.  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  His blood pressure was around 160/90 and he was concerned.  Patient states he still has some tightness, still has occasional dizziness.  He thinks he may possibly be dehydrated.  He denies any recent illness or other complaints.  Past Medical History:  Diagnosis Date  . Hypertension     There are no active problems to display for this patient.   History reviewed. No pertinent surgical history.  Allergies Bee venom; Codeine; and Percocet [oxycodone-acetaminophen]  Social History Social History   Tobacco Use  . Smoking status: Current Every Day Smoker    Packs/day: 1.00    Types: Cigarettes  . Smokeless tobacco: Never Used  Substance Use Topics  . Alcohol use: No  . Drug use: Yes    Types: Marijuana   Review of Systems Constitutional: Negative for fever. Cardiovascular: Positive for chest pain Respiratory: Negative for shortness of breath. Gastrointestinal: Negative for abdominal pain, vomiting and diarrhea. Musculoskeletal: Negative for back pain. Skin: Negative for rash. Neurological: Negative for headaches, focal weakness or numbness.  Positive for dizziness  All systems negative/normal/unremarkable except as stated in the HPI  ____________________________________________   PHYSICAL EXAM:  VITAL SIGNS: ED Triage Vitals  Enc Vitals Group     BP 06/15/18 1232 (!) 160/87     Pulse Rate 06/15/18 1232 81   Resp 06/15/18 1232 17     Temp 06/15/18 1232 98.9 F (37.2 C)     Temp Source 06/15/18 1232 Oral     SpO2 06/15/18 1232 100 %     Weight 06/15/18 1233 190 lb (86.2 kg)     Height 06/15/18 1233 6' (1.829 m)     Head Circumference --      Peak Flow --      Pain Score 06/15/18 1233 3     Pain Loc --      Pain Edu? --      Excl. in GC? --    Constitutional: Alert and oriented. Well appearing and in no distress. Eyes: Conjunctivae are normal. Normal extraocular movements. ENT   Head: Normocephalic and atraumatic.   Nose: No congestion/rhinnorhea.   Mouth/Throat: Mucous membranes are moist.   Neck: No stridor. Cardiovascular: Normal rate, regular rhythm. No murmurs, rubs, or gallops. Respiratory: Normal respiratory effort without tachypnea nor retractions. Breath sounds are clear and equal bilaterally. No wheezes/rales/rhonchi. Gastrointestinal: Soft and nontender. Normal bowel sounds Musculoskeletal: Nontender with normal range of motion in extremities. No lower extremity tenderness nor edema. Neurologic:  Normal speech and language. No gross focal neurologic deficits are appreciated.  Skin:  Skin is warm, dry and intact. No rash noted. Psychiatric: Mood and affect are normal. Speech and behavior are normal.  ____________________________________________  EKG: Interpreted by me.  Sinus rhythm rate 79 bpm, normal PR interval, normal QRS, normal QT.  Flat T waves.  ____________________________________________  ED COURSE:  As part of my medical decision making, I reviewed the following data within the electronic MEDICAL RECORD NUMBER  History obtained from family if available, nursing notes, old chart and ekg, as well as notes from prior ED visits. Patient presented for chest pain and dizziness, we will assess with labs and imaging as indicated at this time.   Procedures ____________________________________________   LABS (pertinent positives/negatives)  Labs Reviewed  CBC -  Abnormal; Notable for the following components:      Result Value   RBC 4.33 (*)    All other components within normal limits  BASIC METABOLIC PANEL  TROPONIN I  TROPONIN I    RADIOLOGY Images were viewed by me  Chest x-ray is normal  ____________________________________________  DIFFERENTIAL DIAGNOSIS   Dehydration, electrolyte abnormality, GERD, musculoskeletal pain, anxiety, unstable angina  FINAL ASSESSMENT AND PLAN  Chest pain, dizziness   Plan: The patient had presented for chest pain and dizziness. Patient's labs are reassuring. Patient's imaging was also reassuring.  No clear etiology for his symptoms at this time.  Have encouraged a baby aspirin daily, he will be referred to cardiology for outpatient follow-up.   Ulice DashJohnathan E Berwyn Bigley, MD   Note: This note was generated in part or whole with voice recognition software. Voice recognition is usually quite accurate but there are transcription errors that can and very often do occur. I apologize for any typographical errors that were not detected and corrected.     Emily FilbertWilliams, Daeron Carreno E, MD 06/15/18 [id number]

## 2018-06-15 NOTE — ED Triage Notes (Signed)
Pt c/o chest pain/tightness with intermittent dizziness since last night. States he went to the nurse at work and checked his b/p 159/90 and was concerned. Pt is in NAD on arrival, skin Is warm and dry respirations WNL

## 2018-06-18 ENCOUNTER — Ambulatory Visit: Payer: 59 | Admitting: Cardiovascular Disease

## 2018-08-17 ENCOUNTER — Encounter: Payer: Self-pay | Admitting: Cardiovascular Disease

## 2018-08-17 ENCOUNTER — Ambulatory Visit (INDEPENDENT_AMBULATORY_CARE_PROVIDER_SITE_OTHER): Payer: 59 | Admitting: Cardiovascular Disease

## 2018-08-17 ENCOUNTER — Encounter: Payer: Self-pay | Admitting: *Deleted

## 2018-08-17 DIAGNOSIS — I10 Essential (primary) hypertension: Secondary | ICD-10-CM | POA: Insufficient documentation

## 2018-08-17 DIAGNOSIS — R079 Chest pain, unspecified: Secondary | ICD-10-CM | POA: Diagnosis not present

## 2018-08-17 DIAGNOSIS — R42 Dizziness and giddiness: Secondary | ICD-10-CM

## 2018-08-17 DIAGNOSIS — F17200 Nicotine dependence, unspecified, uncomplicated: Secondary | ICD-10-CM | POA: Insufficient documentation

## 2018-08-17 NOTE — Progress Notes (Signed)
Cardiology Office Note  Date:  08/17/2018   ID:  Darren Gibson, DOB 07-03-1966, MRN [id number]  PCP:  Patient, No Pcp Per   Chief Complaint  Patient presents with  . New Patient (Initial Visit)    CP [facility] 06/15/18 no cardiac hx. Medications reviewed verbally.     HPI:  Darren Gibson is a 52 year old gentleman with past medical history of Hypertension Smoker, 1 ppd Seen in the emergency room June 15, 2018 for chest pain  Presented to the emergency room with chest pain and tightness with intermittent dizziness since last night.  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  His blood pressure was around 160/90 and he was concerned.  Patient states he still has some tightness, still has occasional dizziness.  He thinks he may possibly be dehydrated.  He denies any recent illness or other complaints.  CT renal stone April 2019 Images pulled up in the office today This shows no aortic atherosclerosis or coronary calcification  He strongly feels the HCTZ has been a contributor to his symptoms of dizziness, general malaise, headaches  Review of records shows chronically low potassium Periods of elevated creatinine concerning for prerenal state  Feels better off HCTZ, has not been taking this the past week and a half 2 weeks  Discussed his smoking, roughly 1 pack/day Does not want vapors  EKG personally reviewed by myself on todays visit Shows normal sinus rhythm with rate 84 bpm no significant ST or T wave changes  PMH:   has a past medical history of Hypertension.  Smoker  PSH:   History reviewed. No pertinent surgical history.  Current Outpatient Medications  Medication Sig Dispense Refill  . traMADol (ULTRAM) 50 MG tablet Take 1 tablet (50 mg total) by mouth every 6 (six) hours as needed. 10 tablet 0  . hydrochlorothiazide (HYDRODIURIL) 12.5 MG tablet Take 1 tablet (12.5 mg total) by mouth daily. (Patient not taking: Reported  on 08/17/2018) 30 tablet 1   No current facility-administered medications for this visit.     Allergies:   Bee venom; Codeine; and Percocet [oxycodone-acetaminophen]   Social History:  The patient  reports that he has been smoking cigarettes. He has been smoking about 1.00 pack per day. He has never used smokeless tobacco. He reports that he has current or past drug history. Drug: Marijuana. He reports that he does not drink alcohol.   Family History:   family history is not on file.    Review of Systems: Review of Systems  Constitutional: Negative.   Respiratory: Negative.   Cardiovascular: Negative.   Gastrointestinal: Negative.   Musculoskeletal: Negative.   Neurological: Positive for headaches.  Psychiatric/Behavioral: Negative.   All other systems reviewed and are negative.    PHYSICAL EXAM: VS:  BP 138/78 (BP Location: Right Arm, Patient Position: Sitting, Cuff Size: Normal)   Pulse 83   Ht 6' (1.829 m)   Wt 187 lb (84.8 kg)   BMI 25.36 kg/m  , BMI Body mass index is 25.36 kg/m. GEN: Well nourished, well developed, in no acute distress  HEENT: normal  Neck: no JVD, carotid bruits, or masses Cardiac: RRR; no murmurs, rubs, or gallops,no edema  Respiratory:  clear to auscultation bilaterally, normal work of breathing GI: soft, nontender, nondistended, + BS MS: no deformity or atrophy  Skin: warm and dry, no rash Neuro:  Strength and sensation are intact Psych: euthymic mood, full affect   Recent  Labs: 06/15/2018: BUN 12; Creatinine, Ser 1.07; Hemoglobin 14.3; Platelets 274; Potassium 3.5; Sodium 140    Lipid Panel No results found for: CHOL, HDL, LDLCALC, TRIG    Wt Readings from Last 3 Encounters:  08/17/18 187 lb (84.8 kg)  06/15/18 190 lb (86.2 kg)  01/15/18 190 lb (86.2 kg)      ASSESSMENT AND PLAN:  Chest pain with moderate risk for cardiac etiology - Plan: EKG 12-Lead Atypical chest pain Possibly exacerbated by low potassium, reproducible No  further cardiac work-up needed CT scan images reviewed with no aortic or coronary calcification, seen him at lower risk  Essential hypertension - Plan: EKG 12-Lead He has not been taking HCTZ for 2 weeks Suggested by blood pressure cuff check pressures at home and call our office with numbers Better blood pressure medications if needed would be amlodipine, losartan On HCTZ has low potassium, dehydration causing problems  Smoker Long discussion, recommended he try Chantix Coupon provided, he would think about it  Dizziness Possibly from dehydration, HCTZ We will stop the HCTZ, recommend he monitor blood pressure at home  Disposition:   F/U as needed   Total encounter time more than 45 minutes  Greater than 50% was spent in counseling and coordination of care with the patient    Orders Placed This Encounter  Procedures  . EKG 12-Lead     Signed, Dossie Arbour, M.D., Ph.D. 08/17/2018  [facility] [place], [place] [phone]

## 2018-08-17 NOTE — Patient Instructions (Addendum)
Medication Instructions:   Please stop the HCTZ  Monitor blood pressure, call with numbers  If you need a refill on your cardiac medications before your next appointment, please call your pharmacy.    Lab work: No new labs needed   If you have labs (blood work) drawn today and your tests are completely normal, you will receive your results only by: Marland Kitchen. MyChart Message (if you have MyChart) OR . A paper copy in the mail If you have any lab test that is abnormal or we need to change your treatment, we will call you to review the results.   Testing/Procedures: No new testing needed   Follow-Up: At [facility], you and your health needs are our priority.  As part of our continuing mission to provide you with exceptional heart care, we have created designated Provider Care Teams.  These Care Teams include your primary Cardiologist (physician) and Advanced Practice Providers (APPs -  Physician Assistants and Nurse Practitioners) who all work together to provide you with the care you need, when you need it.  . You will need a follow up appointment as needed  . Providers on your designated Care Team:   . Nicolasa Duckinghristopher Berge, NP . Eula Listenyan Dunn, PA-C . Marisue IvanJacquelyn Visser, PA-C  Any Other Special Instructions Will Be Listed Below (If Applicable).  For educational health videos Log in to : [email or web address] Or : [email or web address], password : [email or web address]

## 2018-10-21 ENCOUNTER — Other Ambulatory Visit: Payer: Self-pay

## 2018-10-21 ENCOUNTER — Emergency Department
Admission: EM | Admit: 2018-10-21 | Discharge: 2018-10-21 | Disposition: A | Discharge status: Home / Self Care | Payer: 59 | Attending: Emergency Medicine | Admitting: Emergency Medicine

## 2018-10-21 DIAGNOSIS — J101 Influenza due to other identified influenza virus with other respiratory manifestations: Secondary | ICD-10-CM | POA: Diagnosis not present

## 2018-10-21 DIAGNOSIS — F17210 Nicotine dependence, cigarettes, uncomplicated: Secondary | ICD-10-CM | POA: Insufficient documentation

## 2018-10-21 DIAGNOSIS — R05 Cough: Secondary | ICD-10-CM | POA: Insufficient documentation

## 2018-10-21 DIAGNOSIS — F1210 Cannabis abuse, uncomplicated: Secondary | ICD-10-CM | POA: Insufficient documentation

## 2018-10-21 DIAGNOSIS — R0981 Nasal congestion: Secondary | ICD-10-CM | POA: Insufficient documentation

## 2018-10-21 DIAGNOSIS — R51 Headache: Secondary | ICD-10-CM | POA: Diagnosis not present

## 2018-10-21 DIAGNOSIS — I10 Essential (primary) hypertension: Secondary | ICD-10-CM | POA: Diagnosis not present

## 2018-10-21 DIAGNOSIS — M791 Myalgia, unspecified site: Secondary | ICD-10-CM | POA: Diagnosis present

## 2018-10-21 LAB — INFLUENZA PANEL BY PCR (TYPE A & B)
INFLAPCR: POSITIVE — AB
Influenza B By PCR: NEGATIVE

## 2018-10-21 MED ORDER — PROMETHAZINE-DM 6.25-15 MG/5ML PO SYRP: 5.0000 mL | ORAL_SOLUTION | Freq: Four times a day (QID) | ORAL | 0 refills | Status: DC | PRN

## 2018-10-21 MED ORDER — OSELTAMIVIR PHOSPHATE 75 MG PO CAPS: 75.0000 mg | ORAL_CAPSULE | Freq: Two times a day (BID) | ORAL | 0 refills | Status: AC

## 2018-10-21 MED ORDER — IBUPROFEN 600 MG PO TABS: 600.0000 mg | ORAL_TABLET | Freq: Three times a day (TID) | ORAL | 0 refills | Status: DC | PRN

## 2018-10-21 NOTE — ED Provider Notes (Signed)
[facility] Emergency Department Provider Note   ____________________________________________   First MD Initiated Contact with Patient 10/21/18 [phone]     (approximate)  I have reviewed the triage vital signs and the nursing notes.   HISTORY  Chief Complaint Influenza    HPI Darren Gibson is a 53 y.o. male patient presents with body aches, cough, chest congestion, and headache.  Patient state he has been exposed to flu from his grandkids.  Patient state he has taken flu shot for this season.  Patient denies nausea, vomiting, diarrhea.  Patient rates his pain discomfort a 6/10.  Patient describes his pain as "achy".  No palliative measure for complaint.    Past Medical History:  Diagnosis Date  . Hypertension     Patient Active Problem List   Diagnosis Date Noted  . Chest pain with moderate risk for cardiac etiology 08/17/2018  . Essential hypertension 08/17/2018  . Smoker 08/17/2018  . Dizziness 08/17/2018    History reviewed. No pertinent surgical history.  Prior to Admission medications   Medication Sig Start Date End Date Taking? Authorizing Provider  ibuprofen (ADVIL,MOTRIN) 600 MG tablet Take 1 tablet (600 mg total) by mouth every 8 (eight) hours as needed. 10/21/18   Joni Reining, PA-C  oseltamivir (TAMIFLU) 75 MG capsule Take 1 capsule (75 mg total) by mouth 2 (two) times daily for 5 days. 10/21/18 10/26/18  Joni Reining, PA-C  promethazine-dextromethorphan (PROMETHAZINE-DM) 6.25-15 MG/5ML syrup Take 5 mLs by mouth 4 (four) times daily as needed for cough. 10/21/18   Joni Reining, PA-C  traMADol (ULTRAM) 50 MG tablet Take 1 tablet (50 mg total) by mouth every 6 (six) hours as needed. 01/15/18 01/15/19  Willy Eddy, MD    Allergies Bee venom; Codeine; and Percocet [oxycodone-acetaminophen]  History reviewed. No pertinent family history.  Social History Social History   Tobacco Use  . Smoking status: Current Every Day Smoker      Packs/day: 1.00    Types: Cigarettes  . Smokeless tobacco: Never Used  Substance Use Topics  . Alcohol use: No  . Drug use: Yes    Types: Marijuana    Review of Systems Constitutional: No fever/chills.  Body aches. Eyes: No visual changes. ENT: No sore throat.  Nasal congestion. Cardiovascular: Denies chest pain. Respiratory: Denies shortness of breath.  Nonproductive cough. Gastrointestinal: No abdominal pain.  No nausea, no vomiting.  No diarrhea.  No constipation. Genitourinary: Negative for dysuria. Musculoskeletal: Negative for back pain. Skin: Negative for rash. Neurological: Negative for headaches, focal weakness or numbness. ndocrine:  Hypertension. Hematological/Lymphatic:  Allergic/Immunilogical: Bee sting and Percocets. ____________________________________________   PHYSICAL EXAM:  VITAL SIGNS: ED Triage Vitals  Enc Vitals Group     BP 10/21/18 0737 (!) 160/94     Pulse Rate 10/21/18 0737 99     Resp 10/21/18 0737 18     Temp 10/21/18 0737 99.3 F (37.4 C)     Temp Source 10/21/18 0737 Oral     SpO2 10/21/18 0737 99 %     Weight 10/21/18 0738 190 lb (86.2 kg)     Height 10/21/18 0738 6\' 1"  (1.854 m)     Head Circumference --      Peak Flow --      Pain Score 10/21/18 0738 6     Pain Loc --      Pain Edu? --      Excl. in GC? --     Constitutional: Alert and oriented. Well  appearing and in no acute distress. Nose: Edematous nasal turbinates clear rhinorrhea. Mouth/Throat: Mucous membranes are moist.  Oropharynx non-erythematous.  Postnasal drainage. Neck: No stridor. Hematological/Lymphatic/Immunilogical: No cervical lymphadenopathy. Cardiovascular: Normal rate, regular rhythm. Grossly normal heart sounds.  Good peripheral circulation.  Elevated blood pressure. Respiratory: Normal respiratory effort.  No retractions. Lungs CTAB. Neurologic:  Normal speech and language. No gross focal neurologic deficits are appreciated. No gait instability. Skin:   Skin is warm, dry and intact. No rash noted. Psychiatric: Mood and affect are normal. Speech and behavior are normal.  ____________________________________________   LABS (all labs ordered are listed, but only abnormal results are displayed)  Labs Reviewed  INFLUENZA PANEL BY PCR (TYPE A & B) - Abnormal; Notable for the following components:      Result Value   Influenza A By PCR POSITIVE (*)    All other components within normal limits   ____________________________________________  EKG   ____________________________________________  RADIOLOGY  ED MD interpretation:    Official radiology report(s): No results found.  ____________________________________________   PROCEDURES  Procedure(s) performed: None  Procedures  Critical Care performed: No  ____________________________________________   INITIAL IMPRESSION / ASSESSMENT AND PLAN / ED COURSE  As part of my medical decision making, I reviewed the following data within the electronic MEDICAL RECORD NUMBER     Patient presents with flulike symptoms.  Patient tested positive for influenza A.  Patient given discharge care instruction advised take medication as directed.  Patient advised follow-up PCP if condition persist.      ____________________________________________   FINAL CLINICAL IMPRESSION(S) / ED DIAGNOSES  Final diagnoses:  Influenza A     ED Discharge Orders         Ordered    oseltamivir (TAMIFLU) 75 MG capsule  2 times daily     10/21/18 0914    promethazine-dextromethorphan (PROMETHAZINE-DM) 6.25-15 MG/5ML syrup  4 times daily PRN     10/21/18 0914    ibuprofen (ADVIL,MOTRIN) 600 MG tablet  Every 8 hours PRN     10/21/18 0914           Note:  This document was prepared using Dragon voice recognition software and may include unintentional dictation errors.    Joni ReiningSmith, Lenoard Helbert K, PA-C 10/21/18 [phone]    Sharyn CreamerQuale, Mark, MD 10/21/18 2129

## 2018-10-21 NOTE — ED Triage Notes (Signed)
Says he got really hot during night.  Says his grandchildren both have flu and he is worried he has it.

## 2018-10-21 NOTE — ED Triage Notes (Signed)
Pt states he thinks he got flu from grandkids. Symptoms began yesterday. States he feels SOB- cough, congestion. Has been taking advil for fever. A&O, ambulatory. No distress noted.

## 2019-02-26 IMAGING — CT CT RENAL STONE PROTOCOL
2 of 4 series · 16 of 46 positions shown, 18 images · non-contrast
Comparison: None

CLINICAL DATA: LEFT flank pain and back pain since last week
worsened today question kidney stones, history smoking

EXAM:
CT ABDOMEN AND PELVIS WITHOUT CONTRAST
TECHNIQUE: Multidetector CT imaging of the abdomen and pelvis was performed
following the standard protocol without IV contrast. Sagittal and
coronal MPR images reconstructed from axial data set. No oral
contrast administered.

[Series 2: stone full standard · axial · 0.73mm/px · z∈[-951,-561]mm · 13 of 86 slices shown, 15 images]
[im 4/86  soft-tissue]
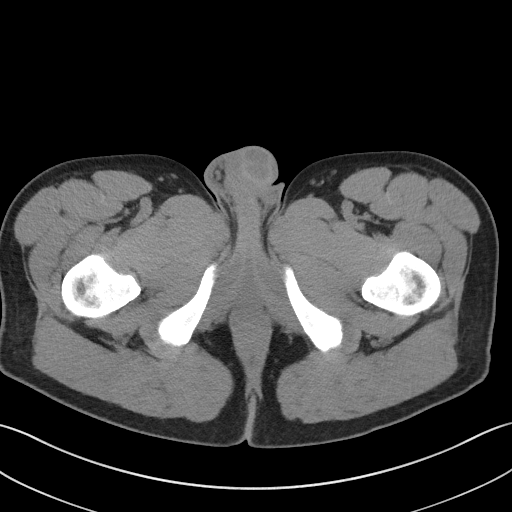
[im 4/86  bone]
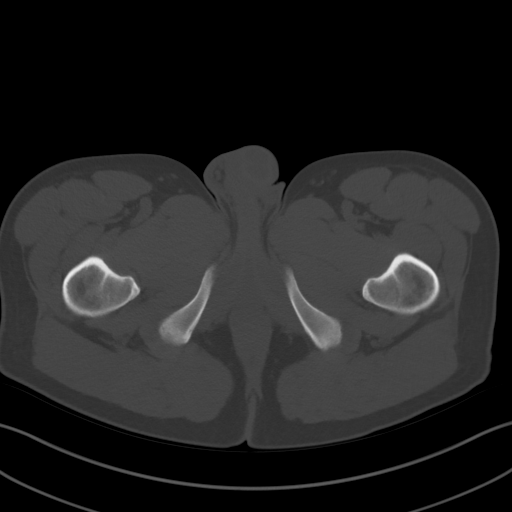
[im 10/86  soft-tissue]
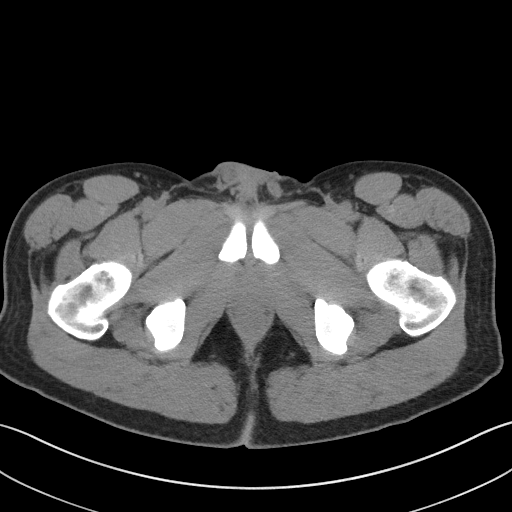
[im 17/86  soft-tissue]
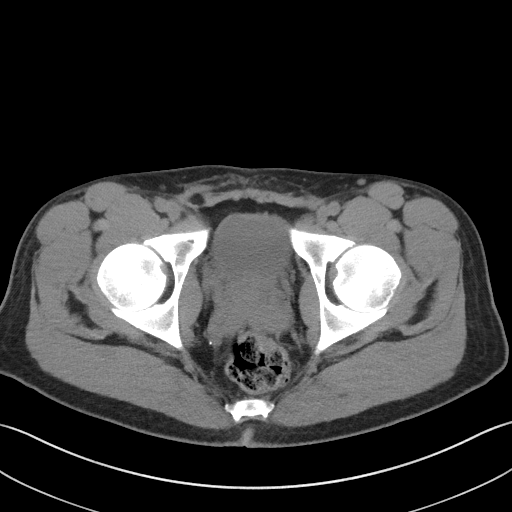
[im 23/86  soft-tissue]
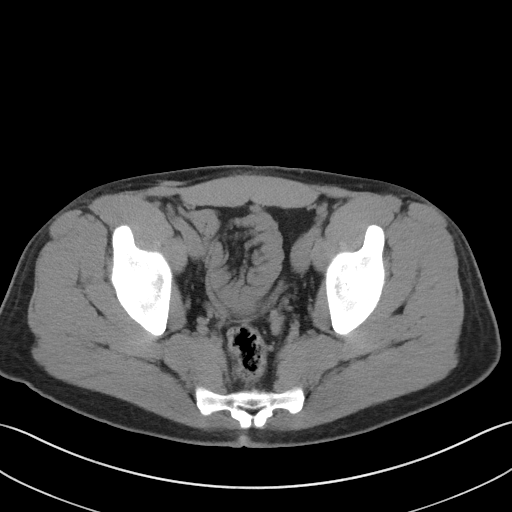
[im 30/86  soft-tissue]
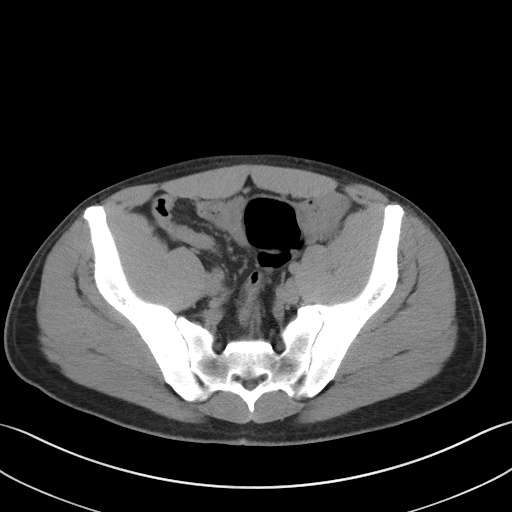
[im 36/86  soft-tissue]
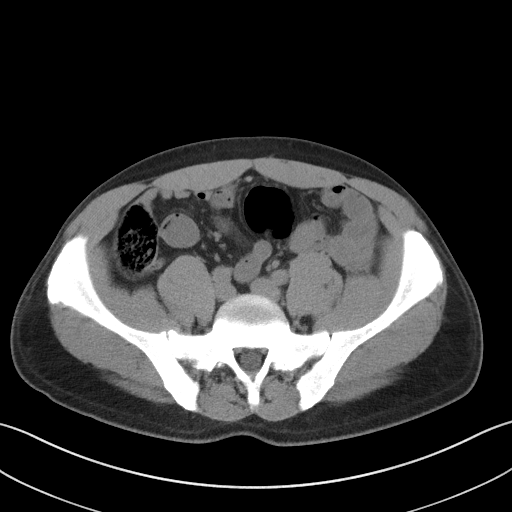
[im 43/86  soft-tissue]
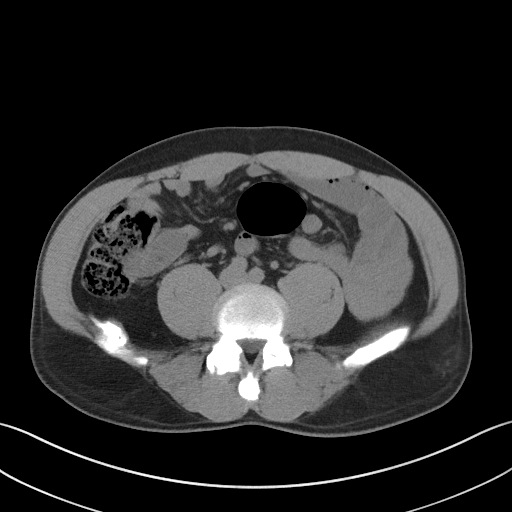
[im 50/86  soft-tissue]
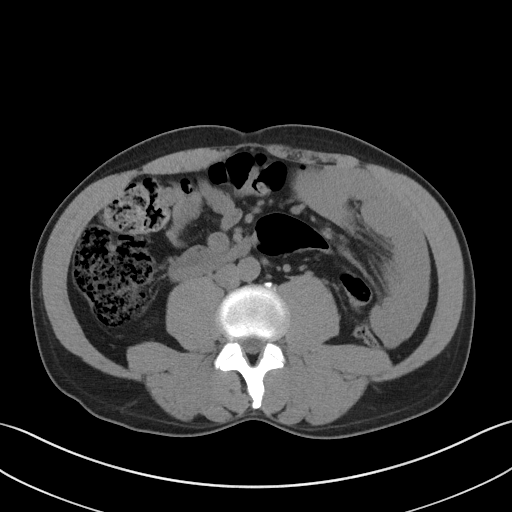
[im 56/86  soft-tissue]
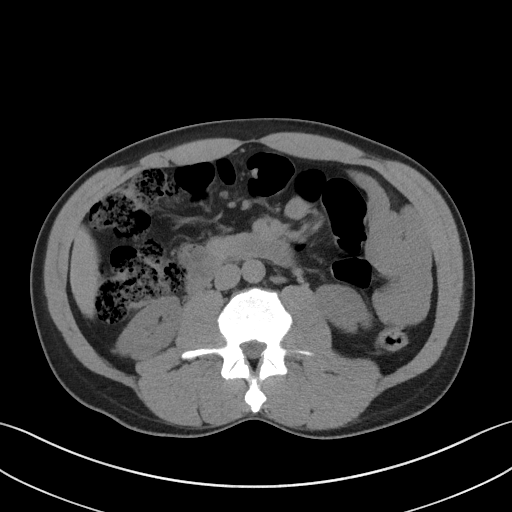
[im 56/86  bone]
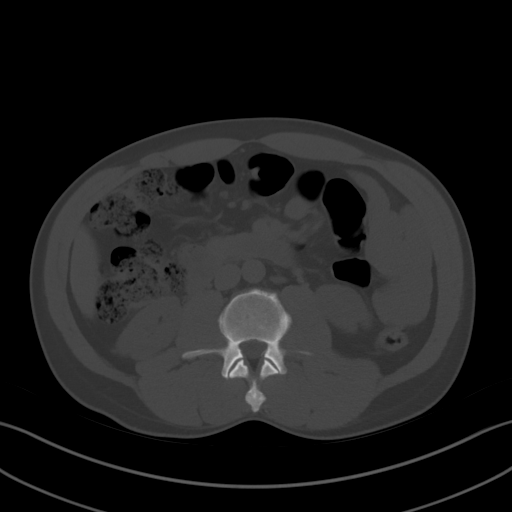
[im 63/86  soft-tissue]
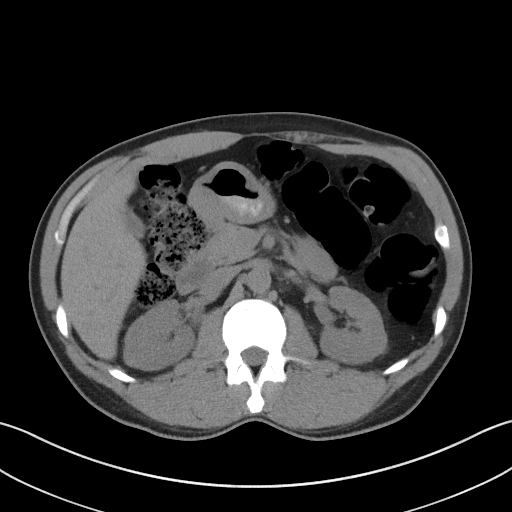
[im 69/86  soft-tissue]
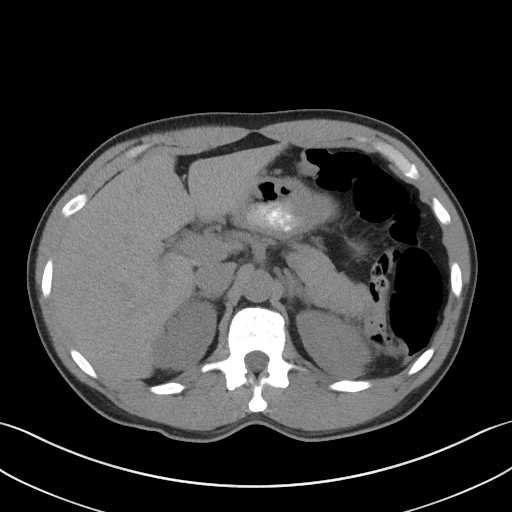
[im 76/86  soft-tissue]
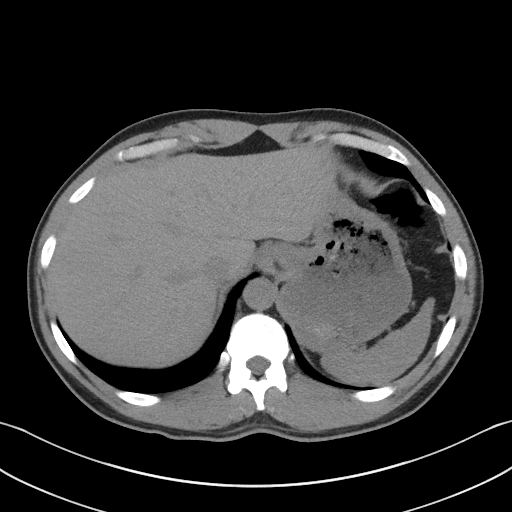
[im 82/86  soft-tissue]
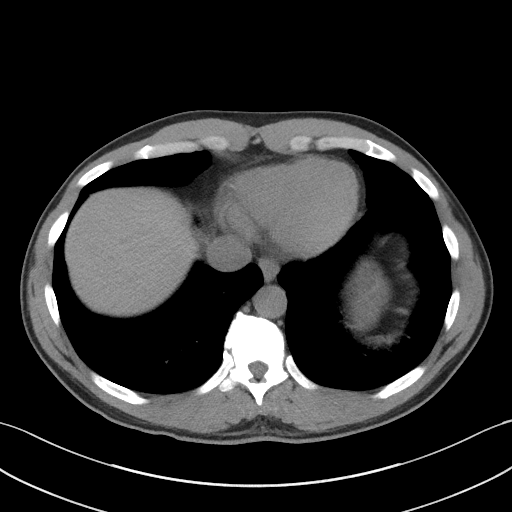

[Series 5: coronal · coronal · 0.70mm/px · 3 of 121 slices shown]
[im 41/121  soft-tissue]
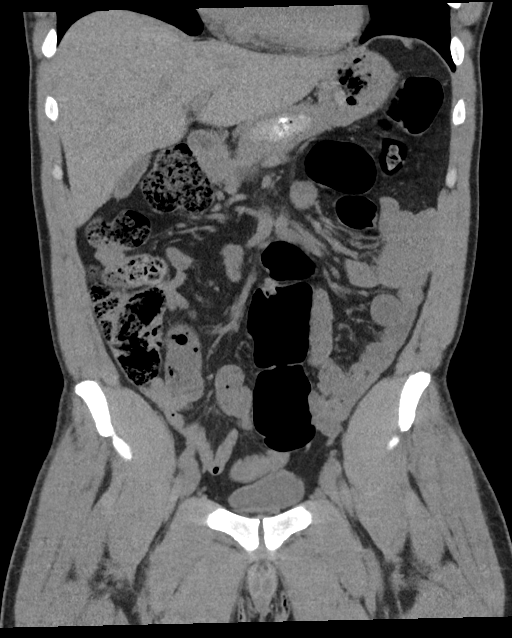
[im 54/121  soft-tissue]
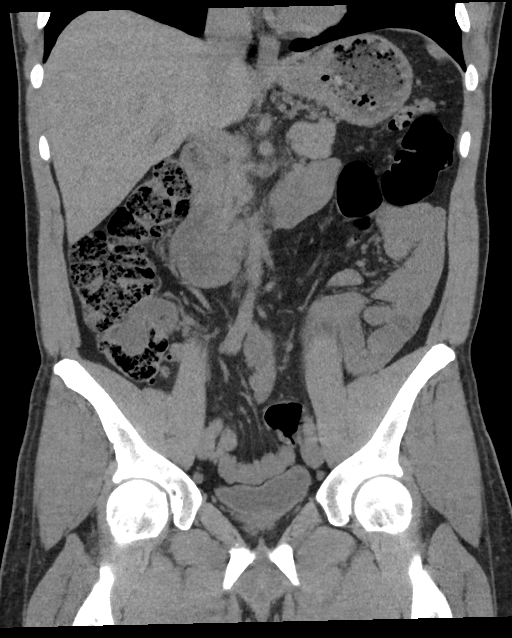
[im 67/121  soft-tissue]
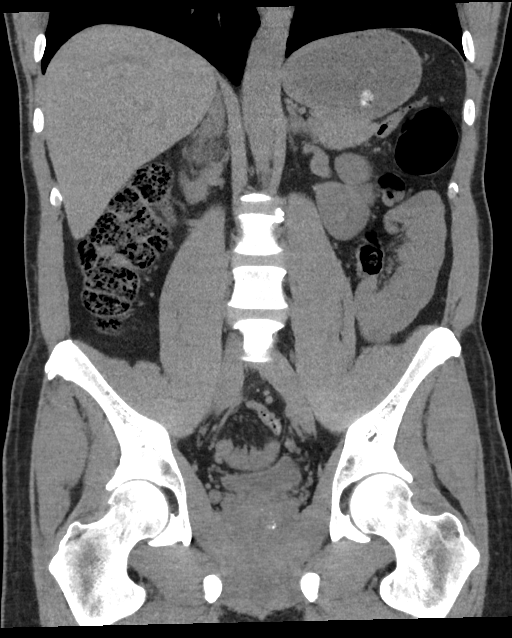

[16 of 46 positions shown; findings below may reference images not displayed]

FINDINGS: Lower chest: Lung bases clear

Hepatobiliary: Contracted gallbladder.  Liver normal appearance

Pancreas: Normal appearance

Spleen: Normal appearance

Adrenals/Urinary Tract: Adrenal glands normal appearance. Kidneys,
ureters, and bladder normal appearance. No urinary tract
calcification or dilatation.

Stomach/Bowel: Normal appendix. Questionable focus of wall
thickening at the distal transverse colon versus artifact from
underdistention. Redundant LEFT colon and splenic flexure. Bowel
loops otherwise normal appearance. Stomach incompletely distended.

Vascular/Lymphatic: Few pelvic phleboliths. Aorta normal caliber. No
adenopathy.

Reproductive: Mild prostatic enlargement with few calcifications.
Seminal vesicles unremarkable.

Other: Tiny umbilical hernia containing fat. No free air or free
fluid.

Musculoskeletal: Degenerative changes at LEFT SI joint. No acute
osseous findings.
IMPRESSION: No acute intra-abdominal or intrapelvic abnormalities definitely
visualized.

Questionable focus of wall thickening of the distal transverse colon
versus artifact from underdistention, cannot exclude malignancy;
recommend colonoscopic evaluation.

Degenerative changes LEFT SI joint.

## 2019-05-10 ENCOUNTER — Other Ambulatory Visit: Payer: Self-pay

## 2019-05-10 ENCOUNTER — Encounter: Payer: Self-pay | Admitting: Emergency Medicine

## 2019-05-10 ENCOUNTER — Emergency Department
Admission: EM | Admit: 2019-05-10 | Discharge: 2019-05-10 | Disposition: A | Discharge status: Home / Self Care | Payer: BC Managed Care – PPO | Attending: Emergency Medicine | Admitting: Emergency Medicine

## 2019-05-10 DIAGNOSIS — H5711 Ocular pain, right eye: Secondary | ICD-10-CM | POA: Diagnosis present

## 2019-05-10 DIAGNOSIS — H00011 Hordeolum externum right upper eyelid: Secondary | ICD-10-CM | POA: Diagnosis not present

## 2019-05-10 DIAGNOSIS — F17210 Nicotine dependence, cigarettes, uncomplicated: Secondary | ICD-10-CM | POA: Insufficient documentation

## 2019-05-10 DIAGNOSIS — I10 Essential (primary) hypertension: Secondary | ICD-10-CM | POA: Diagnosis not present

## 2019-05-10 MED ORDER — TOBRAMYCIN 0.3 % OP SOLN: 2.0000 [drp] | OPHTHALMIC | 0 refills | Status: DC

## 2019-05-10 NOTE — ED Provider Notes (Signed)
[facility] Emergency Department Provider Note  ____________________________________________   None    (approximate)  I have reviewed the triage vital signs and the nursing notes.   HISTORY  Chief Complaint Eye Pain    HPI Darren Gibson is a 53 y.o. male presents to the emergency department complaining of redness and pain to his right eye.  States that felt like a stye but is not sure.  Did have some drainage from the eye.  No watery drainage now.  No matting in the morning.  No known injury.  No change in vision.    Past Medical History:  Diagnosis Date  . Hypertension     Patient Active Problem List   Diagnosis Date Noted  . Chest pain with moderate risk for cardiac etiology 08/17/2018  . Essential hypertension 08/17/2018  . Smoker 08/17/2018  . Dizziness 08/17/2018    History reviewed. No pertinent surgical history.  Prior to Admission medications   Medication Sig Start Date End Date Taking? Authorizing Provider  ibuprofen (ADVIL,MOTRIN) 600 MG tablet Take 1 tablet (600 mg total) by mouth every 8 (eight) hours as needed. 10/21/18   Joni ReiningSmith, Ronald K, PA-C  promethazine-dextromethorphan (PROMETHAZINE-DM) 6.25-15 MG/5ML syrup Take 5 mLs by mouth 4 (four) times daily as needed for cough. 10/21/18   Joni ReiningSmith, Ronald K, PA-C  tobramycin (TOBREX) 0.3 % ophthalmic solution Place 2 drops into both eyes every 4 (four) hours. 05/10/19   Sherrie MustacheFisher, Roselyn BeringSusan W, PA-C    Allergies Bee venom, Codeine, and Percocet [oxycodone-acetaminophen]  No family history on file.  Social History Social History   Tobacco Use  . Smoking status: Current Every Day Smoker    Packs/day: 1.00    Types: Cigarettes  . Smokeless tobacco: Never Used  Substance Use Topics  . Alcohol use: No  . Drug use: Yes    Types: Marijuana    Review of Systems  Constitutional: No fever/chills Eyes: No visual changes.  Positive right upper eyelid pain ENT: No sore throat. Respiratory:  Denies cough Genitourinary: Negative for dysuria. Musculoskeletal: Negative for back pain. Skin: Negative for rash.    ____________________________________________   PHYSICAL EXAM:  VITAL SIGNS: ED Triage Vitals  Enc Vitals Group     BP 05/10/19 1023 (!) 152/90     Pulse Rate 05/10/19 1023 97     Resp 05/10/19 1023 18     Temp 05/10/19 1023 98.8 F (37.1 C)     Temp Source 05/10/19 1023 Oral     SpO2 05/10/19 1023 97 %     Weight 05/10/19 1024 190 lb (86.2 kg)     Height 05/10/19 1024 6' (1.829 m)     Head Circumference --      Peak Flow --      Pain Score 05/10/19 1031 9     Pain Loc --      Pain Edu? --      Excl. in GC? --     Constitutional: Alert and oriented. Well appearing and in no acute distress. Eyes: Conjunctiva mildly injected at the right eye,.  Right upper eyelid is swollen and tender at the lash line, typical of a stye Head: Atraumatic. Nose: No congestion/rhinnorhea. Mouth/Throat: Mucous membranes are moist.   Neck:  supple no lymphadenopathy noted Cardiovascular: Normal rate, regular rhythm. Heart sounds are normal Respiratory: Normal respiratory effort.  No retractions, lungs c t a  GU: deferred Musculoskeletal: FROM all extremities, warm and well perfused Neurologic:  Normal speech and language.  Skin:  Skin is warm, dry and intact. No rash noted. Psychiatric: Mood and affect are normal. Speech and behavior are normal.  ____________________________________________   LABS (all labs ordered are listed, but only abnormal results are displayed)  Labs Reviewed - No data to display ____________________________________________   ____________________________________________  RADIOLOGY    ____________________________________________   PROCEDURES  Procedure(s) performed: No  Procedures    ____________________________________________   INITIAL IMPRESSION / ASSESSMENT AND PLAN / ED COURSE  Pertinent labs & imaging results that were  available during my care of the patient were reviewed by me and considered in my medical decision making (see chart for details).   Patient is 53 year old male presents emergency department with complaints of questionable stye of the right eye.  Physical exam shows the right upper lid to be swollen and tender, some redness of the conjunctiva.  Explained findings to the patient.  He is to apply warm compresses much as possible.  Use the medication as prescribed.  Follow-up with [facility] if not better in 3 to 5 days.  Return emergency department worsening.  States he understands will comply.  Is discharged stable condition.    Darren Gibson was evaluated in Emergency Department on 05/10/2019 for the symptoms described in the history of present illness. He was evaluated in the context of the global COVID-19 pandemic, which necessitated consideration that the patient might be at risk for infection with the SARS-CoV-2 virus that causes COVID-19. Institutional protocols and algorithms that pertain to the evaluation of patients at risk for COVID-19 are in a state of rapid change based on information released by regulatory bodies including the CDC and federal and state organizations. These policies and algorithms were followed during the patient's care in the ED.   As part of my medical decision making, I reviewed the following data within the [place] notes reviewed and incorporated, Old chart reviewed, Notes from prior ED visits and [place] Controlled Substance Database  ____________________________________________   FINAL CLINICAL IMPRESSION(S) / ED DIAGNOSES  Final diagnoses:  Hordeolum externum of right upper eyelid      NEW MEDICATIONS STARTED DURING THIS VISIT:  New Prescriptions   TOBRAMYCIN (TOBREX) 0.3 % OPHTHALMIC SOLUTION    Place 2 drops into both eyes every 4 (four) hours.     Note:  This document was prepared using Dragon voice recognition  software and may include unintentional dictation errors.    Versie Starks, PA-C 05/10/19 1129    Carrie Mew, MD 05/10/19 2032

## 2019-05-10 NOTE — Discharge Instructions (Signed)
Follow-up with your regular doctor if not better in 3 days or [facility].  If your becoming worse please return emergency department.  Use medication as prescribed.  Apply warm wet compress as much as possible to the right upper eye.

## 2019-05-10 NOTE — ED Notes (Signed)
See triage notes.  Patient rates pain 9/10. 

## 2019-05-10 NOTE — ED Triage Notes (Signed)
Pt states awoke this am with redness and pain to his right eye. Pt denies discharge but states every time he blinks it hurts. Pt with slight swelling noted to eyelid. Denies injuries.

## 2019-05-10 NOTE — ED Notes (Signed)
See triage notes.  Patient rates pain 9/10.

## 2019-08-02 ENCOUNTER — Encounter: Payer: Self-pay | Admitting: Emergency Medicine

## 2019-08-02 ENCOUNTER — Other Ambulatory Visit: Payer: Self-pay

## 2019-08-02 ENCOUNTER — Emergency Department: Payer: BC Managed Care – PPO

## 2019-08-02 ENCOUNTER — Emergency Department
Admission: EM | Admit: 2019-08-02 | Discharge: 2019-08-02 | Disposition: A | Discharge status: Home / Self Care | Payer: BC Managed Care – PPO | Attending: Emergency Medicine | Admitting: Emergency Medicine

## 2019-08-02 DIAGNOSIS — Z79899 Other long term (current) drug therapy: Secondary | ICD-10-CM | POA: Diagnosis not present

## 2019-08-02 DIAGNOSIS — R0789 Other chest pain: Secondary | ICD-10-CM | POA: Insufficient documentation

## 2019-08-02 DIAGNOSIS — F17210 Nicotine dependence, cigarettes, uncomplicated: Secondary | ICD-10-CM | POA: Insufficient documentation

## 2019-08-02 DIAGNOSIS — R519 Headache, unspecified: Secondary | ICD-10-CM | POA: Diagnosis present

## 2019-08-02 DIAGNOSIS — G44209 Tension-type headache, unspecified, not intractable: Secondary | ICD-10-CM | POA: Insufficient documentation

## 2019-08-02 DIAGNOSIS — I10 Essential (primary) hypertension: Secondary | ICD-10-CM | POA: Diagnosis not present

## 2019-08-02 LAB — BASIC METABOLIC PANEL
Anion gap: 10 (ref 5–15)
BUN: 14 mg/dL (ref 6–20)
CO2: 25 mmol/L (ref 22–32)
Calcium: 9.3 mg/dL (ref 8.9–10.3)
Chloride: 105 mmol/L (ref 98–111)
Creatinine, Ser: 0.99 mg/dL (ref 0.61–1.24)
GFR calc Af Amer: >60 mL/min (ref >60)
GFR calc non Af Amer: >60 mL/min (ref >60)
Glucose, Bld: 89 mg/dL (ref 70–99)
Potassium: 3.7 mmol/L (ref 3.5–5.1)
Sodium: 140 mmol/L (ref 135–145)

## 2019-08-02 LAB — CBC
HCT: 44.2 % (ref 39.0–52.0)
Hemoglobin: 14.7 g/dL (ref 13.0–17.0)
MCH: 31.6 pg (ref 26.0–34.0)
MCHC: 33.3 g/dL (ref 30.0–36.0)
MCV: 95.1 fL (ref 80.0–100.0)
Platelets: 314 10*3/uL (ref 150–400)
RBC: 4.65 MIL/uL (ref 4.22–5.81)
RDW: 11.9 % (ref 11.5–15.5)
WBC: 9.9 10*3/uL (ref 4.0–10.5)
nRBC: 0 % (ref 0.0–0.2)

## 2019-08-02 LAB — TROPONIN I (HIGH SENSITIVITY): Troponin I (High Sensitivity): 3 ng/L (ref <18)

## 2019-08-02 NOTE — ED Provider Notes (Signed)
[facility] Emergency Department Provider Note   ____________________________________________    I have reviewed the triage vital signs and the nursing notes.   HISTORY  Chief Complaint Headache     HPI Darren Gibson is a 53 y.o. male who presents with complaints of headache.  Patient reports over the last 2 weeks he has had intermittent headaches that seem to start in his neck and travel towards his forehead on both sides of his head.  He reports that improved with 600 mg of ibuprofen.  Denies changes in vision or neuro deficits.  Currently is feeling quite well.  No nausea or vomiting.  No injuries.  Also notes that he had tightness to his chest yesterday and some mild dizziness although that is resolved, he states he is not particularly worried about that.  No fevers or chills  Past Medical History:  Diagnosis Date  . Hypertension     Patient Active Problem List   Diagnosis Date Noted  . Chest pain with moderate risk for cardiac etiology 08/17/2018  . Essential hypertension 08/17/2018  . Smoker 08/17/2018  . Dizziness 08/17/2018    History reviewed. No pertinent surgical history.  Prior to Admission medications   Medication Sig Start Date End Date Taking? Authorizing Provider  tobramycin (TOBREX) 0.3 % ophthalmic solution Place 2 drops into both eyes every 4 (four) hours. 05/10/19   Sherrie Mustache Roselyn Bering, PA-C     Allergies Bee venom, Codeine, and Percocet [oxycodone-acetaminophen]  No family history on file.  Social History Social History   Tobacco Use  . Smoking status: Current Every Day Smoker    Packs/day: 1.00    Types: Cigarettes  . Smokeless tobacco: Never Used  Substance Use Topics  . Alcohol use: No  . Drug use: Yes    Types: Marijuana    Review of Systems  Constitutional: No fever/chills Eyes: No visual changes.  ENT: No sore throat. Cardiovascular: As above Respiratory: Denies shortness of breath. Gastrointestinal:  No abdominal pain.  No nausea, no vomiting.   Genitourinary: Negative for dysuria. Musculoskeletal: Negative for back pain. Skin: Negative for rash. Neurological: As above   ____________________________________________   PHYSICAL EXAM:  VITAL SIGNS: ED Triage Vitals  Enc Vitals Group     BP 08/02/19 1137 (!) 146/88     Pulse Rate 08/02/19 1137 83     Resp 08/02/19 1137 14     Temp 08/02/19 1137 98.6 F (37 C)     Temp Source 08/02/19 1137 Oral     SpO2 08/02/19 1137 99 %     Weight 08/02/19 1136 86.2 kg (190 lb)     Height 08/02/19 1136 1.829 m (6')     Head Circumference --      Peak Flow --      Pain Score 08/02/19 1136 6     Pain Loc --      Pain Edu? --      Excl. in GC? --     Constitutional: Alert and oriented.  Eyes: Conjunctivae are normal.  PERRLA, EOMI Head: Atraumatic.  No sinus tenderness  Mouth/Throat: Mucous membranes are moist.   Neck:  Painless ROM, mild tenderness over the trapezius insertion sites Cardiovascular: Normal rate, regular rhythm. Grossly normal heart sounds.  Good peripheral circulation. Respiratory: Normal respiratory effort.  No retractions. Lungs CTAB.   Musculoskeletal: No lower extremity tenderness nor edema.  Warm and well perfused Neurologic:  Normal speech and language. No gross focal neurologic deficits are appreciated.  Skin:  Skin is warm, dry and intact. No rash noted. Psychiatric: Mood and affect are normal. Speech and behavior are normal.  ____________________________________________   LABS (all labs ordered are listed, but only abnormal results are displayed)  Labs Reviewed  BASIC METABOLIC PANEL  CBC  TROPONIN I (HIGH SENSITIVITY)   ____________________________________________  EKG  ED ECG REPORT I, Lavonia Drafts, the attending physician, personally viewed and interpreted this ECG.  Date: 08/02/2019  Rhythm: normal sinus rhythm QRS Axis: normal Intervals: normal ST/T Wave abnormalities: normal  Narrative Interpretation: no evidence of acute ischemia  ____________________________________________  RADIOLOGY  Chest x-ray unremarkable ____________________________________________   PROCEDURES  Procedure(s) performed: No  Procedures   Critical Care performed: No ____________________________________________   INITIAL IMPRESSION / ASSESSMENT AND PLAN / ED COURSE  Pertinent labs & imaging results that were available during my care of the patient were reviewed by me and considered in my medical decision making (see chart for details).  Patient well-appearing and in no acute distress.  Lab works quite reassuring, EKG is unremarkable, troponin is normal.  Chest x-ray is benign.  No chest pain today.  Suspect tension headache as the cause of his headaches, recommend continued symptomatic relief, outpatient follow-up if no improvement    ____________________________________________   FINAL CLINICAL IMPRESSION(S) / ED DIAGNOSES  Final diagnoses:  Tension headache        Note:  This document was prepared using Dragon voice recognition software and may include unintentional dictation errors.   Lavonia Drafts, MD 08/02/19 1949

## 2019-08-02 NOTE — ED Triage Notes (Signed)
Pt c/o headache x2weeks but worsening today. PT also c/o  tightness to his chest and dizziness. PT is A&OX4, NAD noted

## 2019-08-02 NOTE — ED Notes (Signed)
Ambulatory to room with steady gait. A&O x4. Even and non labored respirations noted. Patient reports headache, dizziness and chest tightness x 2 weeks. Even and non labored respirations noted.

## 2019-09-02 ENCOUNTER — Other Ambulatory Visit: Payer: Self-pay | Admitting: Family Medicine

## 2019-09-02 ENCOUNTER — Other Ambulatory Visit: Payer: Self-pay

## 2019-09-02 DIAGNOSIS — Z20822 Contact with and (suspected) exposure to covid-19: Secondary | ICD-10-CM

## 2019-09-05 LAB — NOVEL CORONAVIRUS, NAA: SARS-CoV-2, NAA: NOT DETECTED

## 2019-09-19 ENCOUNTER — Other Ambulatory Visit: Payer: Self-pay | Admitting: Family Medicine

## 2019-09-19 DIAGNOSIS — Z20822 Contact with and (suspected) exposure to covid-19: Secondary | ICD-10-CM

## 2019-09-20 ENCOUNTER — Ambulatory Visit: Payer: BC Managed Care – PPO | Attending: Internal Medicine

## 2019-09-20 DIAGNOSIS — Z20822 Contact with and (suspected) exposure to covid-19: Secondary | ICD-10-CM

## 2019-09-22 LAB — NOVEL CORONAVIRUS, NAA: SARS-CoV-2, NAA: NOT DETECTED

## 2019-10-18 NOTE — Progress Notes (Signed)
Office Visit    Patient Name: Darren Gibson Date of Encounter: 10/18/2019  Primary Care Provider:  Patient, No Pcp Per Primary Cardiologist:  Ida Rogue, MD Electrophysiologist:  None   Chief Complaint    Darren Gibson is a 54 y.o. male with a hx of HTN, tobacco abuse, chest pain presents today for follow up of HTN, chest pain.   Past Medical History    Past Medical History:  Diagnosis Date  . Hypertension    No past surgical history on file.  Allergies  Allergies  Allergen Reactions  . Bee Venom Anaphylaxis  . Codeine Anaphylaxis  . Percocet [Oxycodone-Acetaminophen] Nausea And Vomiting    History of Present Illness    Darren Gibson is a 54 y.o. male with a hx of HTN, tobacco abuse, chest pain, hypokalemia last seen 08/17/2018 by Dr. Rockey Situ.  CT 12/2017 with no aortic atherosclerosis nor coronary artery calcification. ED visit 06/15/2018 with chest pain, tightness, dizziness. BP elevated 160/90 prior to ED visit. His chest pain was noted to be reproducible, thought to be exacerbated by hypokalemia, and no further cardia workup pursued.   Of note, HCTZ was discontinued due to complaints of dizziness, lightheadedness.   Seen by [facility] 08/02/19 for concern of elevated blood pressure, substernal chest pain. Noted to not be on antihypertensive medications. Per note, was sent to ED.   ED visit to [facility] 08/02/19 with chief complaint of headache. EKG unremarkable, troponin normal, CXR no acute findings. Ches tpain noted to be day prior without recurrence. Dx with tension headache.    Headaches for the last week.  Has been taking ibuprofen 652m in the am 6089min the pm.  We discussed that NSAIDs could be contributory to elevated blood pressure.  He was encouraged to use Tylenol.  Endorses fatigue over the last month.  Endorses some dyspnea with activity.  Reports he does not sleep well.  Wakes up in the middle of night.    No chest pain, pressure, tightness.  No  orthopnea, PND, edema.  EKGs/Labs/Other Studies Reviewed:   The following studies were reviewed today:  EKG:  EKG is  ordered today.  The ekg ordered today demonstrates SR 95 bpm with nonspecific ST/Twave abnormality with overall low voltage T-waves - no acute ST/T wave changes.   Recent Labs: 08/02/2019: BUN 14; Creatinine, Ser 0.99; Hemoglobin 14.7; Platelets 314; Potassium 3.7; Sodium 140  Recent Lipid Panel No results found for: CHOL, TRIG, HDL, CHOLHDL, VLDL, LDLCALC, LDLDIRECT  Home Medications   No outpatient medications have been marked as taking for the 10/19/19 encounter (Appointment) with WaLoel DubonnetNP.    Review of Systems   Review of Systems  Constitution: Positive for malaise/fatigue. Negative for chills and fever.       (+) headaches  Cardiovascular: Positive for dyspnea on exertion. Negative for chest pain, leg swelling, near-syncope, orthopnea, palpitations, paroxysmal nocturnal dyspnea and syncope.  Respiratory: Negative for cough, shortness of breath and wheezing.   Gastrointestinal: Negative for nausea and vomiting.  Neurological: Negative for dizziness, light-headedness and weakness.  Psychiatric/Behavioral: The patient has insomnia.    All other systems reviewed and are otherwise negative except as noted above.  Physical Exam    VS:  There were no vitals taken for this visit. , BMI There is no height or weight on file to calculate BMI. GEN: Well nourished, well developed, in no acute distress. HEENT: normal. Neck: Supple, no JVD, carotid bruits, or masses. Cardiac: RRR, no murmurs, rubs,  or gallops. No clubbing, cyanosis, edema.  Radials/DP/PT 2+ and equal bilaterally.  Respiratory:  Respirations regular and unlabored, clear to auscultation bilaterally. GI: Soft, nontender, nondistended, BS + x 4. MS: No deformity or atrophy. Skin: Warm and dry, no rash. Neuro:  Strength and sensation are intact. Psych: Normal affect.  Accessory Clinical  Findings    ECG personally reviewed by me today -  SR 95 bpm with nonspecific ST/Twave abnormality with overall low voltage T-waves - no acute ST/T wave changes.   - no acute changes.  Assessment & Plan    1. HTN -not presently on antihypertensive therapy.  Previously on hydrochlorothiazide with side effect of dizziness.  Blood pressure significantly elevated today.  Start losartan 25 mg daily.  Encouraged to check his blood pressure at home.  Follow-up in 1 month for recheck of blood pressure.  C-Met today.  2. DOE - Present over the last month.  Etiology uncontrolled hypertension versus deconditioning.  Optimization of blood pressure, as above. Low suspicion HF as no orthopnea, PND, edema. Low suspicion CAD as no chest pain, normal EKG today.  3. Insomnia -reports difficulty sleeping.  Wakes up in the middle night.  Endorses morning headache, daytime fatigue.  Reports he does not snore.  May benefit from sleep study in the future.  4. Tobacco abuse - Smoking cessation encouraged. Recommend utilization of [id number]. Lipid panel today.  5. Dizziness -previously had dizziness while hydrochlorothiazide.  No recurrence.  Will avoid diuretics or thiazide diuretics for optimization of his BP.  Disposition: Follow up in 1 month(s) with Dr. Rockey Situ or APP   Loel Dubonnet, NP 10/18/2019, 11:59 AM

## 2019-10-19 ENCOUNTER — Ambulatory Visit (INDEPENDENT_AMBULATORY_CARE_PROVIDER_SITE_OTHER): Payer: BC Managed Care – PPO | Admitting: Family

## 2019-10-19 ENCOUNTER — Other Ambulatory Visit: Payer: Self-pay

## 2019-10-19 ENCOUNTER — Encounter: Payer: Self-pay | Admitting: Family

## 2019-10-19 VITALS — BP 160/92 | HR 95 | Ht 72.0 in | Wt 186.0 lb

## 2019-10-19 DIAGNOSIS — Z720 Tobacco use: Secondary | ICD-10-CM

## 2019-10-19 DIAGNOSIS — I10 Essential (primary) hypertension: Secondary | ICD-10-CM

## 2019-10-19 DIAGNOSIS — R42 Dizziness and giddiness: Secondary | ICD-10-CM | POA: Diagnosis not present

## 2019-10-19 DIAGNOSIS — R0609 Other forms of dyspnea: Secondary | ICD-10-CM

## 2019-10-19 DIAGNOSIS — R0600 Dyspnea, unspecified: Secondary | ICD-10-CM

## 2019-10-19 DIAGNOSIS — G4700 Insomnia, unspecified: Secondary | ICD-10-CM

## 2019-10-19 MED ORDER — LOSARTAN POTASSIUM 25 MG PO TABS: 25.0000 mg | ORAL_TABLET | Freq: Every day | ORAL | 2 refills | Status: AC

## 2019-10-19 NOTE — Patient Instructions (Addendum)
Medication Instructions:  Your physician has recommended you make the following change in your medication:   START Losartan 25mg  daily   *If you need a refill on your cardiac medications before your next appointment, please call your pharmacy*  Lab Work: Your physician recommends that you return for lab work today: CMET, lipid panel  If you have labs (blood work) drawn today and your tests are completely normal, you will receive your results only by: Marland Kitchen MyChart Message (if you have MyChart) OR . A paper copy in the mail If you have any lab test that is abnormal or we need to change your treatment, we will call you to review the results.  Testing/Procedures: You had an EKG today. It showed sinus rhythm. This is a good result.   Follow-Up: At [facility], you and your health needs are our priority.  As part of our continuing mission to provide you with exceptional heart care, we have created designated Provider Care Teams.  These Care Teams include your primary Cardiologist (physician) and Advanced Practice Providers (APPs -  Physician Assistants and Nurse Practitioners) who all work together to provide you with the care you need, when you need it.  Your next appointment: 1 month   Recommend Acetaminophen (Tylenol) instead of ibuprofen for headache.  Tips to Measure your Blood Pressure Correctly  To determine whether you have hypertension, a medical professional will take a blood pressure reading. How you prepare for the test, the position of your arm, and other factors can change a blood pressure reading by 10% or more. That could be enough to hide high blood pressure, start you on a drug you don't really need, or lead your doctor to incorrectly adjust your medications.  National and international guidelines offer specific instructions for measuring blood pressure. If a doctor, nurse, or medical assistant isn't doing it right, don't hesitate to ask him or her to get with the  guidelines.  Here's what you can do to ensure a correct reading: . Don't drink a caffeinated beverage or smoke during the 30 minutes before the test. . Sit quietly for five minutes before the test begins. . During the measurement, sit in a chair with your feet on the floor and your arm supported so your elbow is at about heart level. . The inflatable part of the cuff should completely cover at least 80% of your upper arm, and the cuff should be placed on bare skin, not over a shirt. . Don't talk during the measurement. . Have your blood pressure measured twice, with a brief break in between. If the readings are different by 5 points or more, have it done a third time.  There are times to break these rules. If you sometimes feel lightheaded when getting out of bed in the morning or when you stand after sitting, you should have your blood pressure checked while seated and then while standing to see if it falls from one position to the next.  In 2017, new guidelines from the [place], the [employer] of Cardiology, and nine other health organizations lowered the diagnosis of high blood pressure to 130/80 mm Hg or higher for all adults. The guidelines also redefined the various blood pressure categories to now include normal, elevated, Stage 1 hypertension, Stage 2 hypertension, and hypertensive crisis (see "Blood pressure categories").  Blood pressure categories  Blood pressure category SYSTOLIC (upper number)  DIASTOLIC (lower number)  Normal Less than 120 mm Hg and Less than 80 mm  Hg  Elevated 120-129 mm Hg and Less than 80 mm Hg  High blood pressure: Stage 1 hypertension 130-139 mm Hg or 80-89 mm Hg  High blood pressure: Stage 2 hypertension 140 mm Hg or higher or 90 mm Hg or higher  Hypertensive crisis (consult your doctor immediately) Higher than 180 mm Hg and/or Higher than 120 mm Hg  Source: American Heart Association and American Stroke Association. For more on  getting your blood pressure under control, buy Controlling Your Blood Pressure, a Special Health Report from [facility].   Blood Pressure Log   Date   Time  Blood Pressure  Position  Example: Nov 1 9 AM 124/78 sitting

## 2019-10-20 LAB — COMPREHENSIVE METABOLIC PANEL
ALT: 17 IU/L (ref 0–44)
AST: 21 IU/L (ref 0–40)
Albumin/Globulin Ratio: 1.7 (ref 1.2–2.2)
Albumin: 4.5 g/dL (ref 3.8–4.9)
Alkaline Phosphatase: 67 IU/L (ref 39–117)
BUN/Creatinine Ratio: 11 (ref 9–20)
BUN: 13 mg/dL (ref 6–24)
Bilirubin Total: 0.5 mg/dL (ref 0.0–1.2)
CO2: 23 mmol/L (ref 20–29)
Calcium: 8.9 mg/dL (ref 8.7–10.2)
Chloride: 107 mmol/L — ABNORMAL HIGH (ref 96–106)
Creatinine, Ser: 1.15 mg/dL (ref 0.76–1.27)
GFR calc Af Amer: 84 mL/min/{1.73_m2} (ref >59)
GFR calc non Af Amer: 72 mL/min/{1.73_m2} (ref >59)
Globulin, Total: 2.7 g/dL (ref 1.5–4.5)
Glucose: 101 mg/dL — ABNORMAL HIGH (ref 65–99)
Potassium: 3.8 mmol/L (ref 3.5–5.2)
Sodium: 142 mmol/L (ref 134–144)
Total Protein: 7.2 g/dL (ref 6.0–8.5)

## 2019-10-20 LAB — LIPID PANEL
Chol/HDL Ratio: 3.3 ratio (ref 0.0–5.0)
Cholesterol, Total: 149 mg/dL (ref 100–199)
HDL: 45 mg/dL (ref >39)
LDL Chol Calc (NIH): 91 mg/dL (ref 0–99)
Triglycerides: 65 mg/dL (ref 0–149)
VLDL Cholesterol Cal: 13 mg/dL (ref 5–40)

## 2019-11-21 NOTE — Progress Notes (Deleted)
Cardiology Office Note  Date:  11/21/2019   ID:  Darren Gibson, DOB [birth year], MRN [id number]  PCP:  Patient, No Pcp Per   No chief complaint on file.   HPI:  Darren Gibson is a 54 year old gentleman with past medical history of Hypertension Smoker, 1 ppd Seen in the emergency room June 15, 2018 for chest pain  Presented to the emergency room with chest pain and tightness with intermittent dizziness since last night.  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  His blood pressure was around 160/90 and he was concerned.  Patient states he still has some tightness, still has occasional dizziness.  He thinks he may possibly be dehydrated.  He denies any recent illness or other complaints.  CT renal stone April 2019 Images pulled up in the office today This shows no aortic atherosclerosis or coronary calcification  He strongly feels the HCTZ has been a contributor to his symptoms of dizziness, general malaise, headaches  Review of records shows chronically low potassium Periods of elevated creatinine concerning for prerenal state  Feels better off HCTZ, has not been taking this the past week and a half 2 weeks  Discussed his smoking, roughly 1 pack/day Does not want vapors  EKG personally reviewed by myself on todays visit Shows normal sinus rhythm with rate 84 bpm no significant ST or T wave changes  PMH:   has a past medical history of Hypertension.  Smoker  PSH:   No past surgical history on file.  Current Outpatient Medications  Medication Sig Dispense Refill  . losartan (COZAAR) 25 MG tablet Take 1 tablet (25 mg total) by mouth daily. 30 tablet 2   No current facility-administered medications for this visit.    Allergies:   Bee venom, Codeine, and Percocet [oxycodone-acetaminophen]   Social History:  The patient  reports that he has been smoking cigarettes. He has been smoking about 1.00 pack per day. He has never  used smokeless tobacco. He reports current drug use. Drug: Marijuana. He reports that he does not drink alcohol.   Family History:   family history is not on file.    Review of Systems: Review of Systems  Constitutional: Negative.   Respiratory: Negative.   Cardiovascular: Negative.   Gastrointestinal: Negative.   Musculoskeletal: Negative.   Neurological: Positive for headaches.  Psychiatric/Behavioral: Negative.   All other systems reviewed and are negative.    PHYSICAL EXAM: VS:  There were no vitals taken for this visit. , BMI There is no height or weight on file to calculate BMI. GEN: Well nourished, well developed, in no acute distress  HEENT: normal  Neck: no JVD, carotid bruits, or masses Cardiac: RRR; no murmurs, rubs, or gallops,no edema  Respiratory:  clear to auscultation bilaterally, normal work of breathing GI: soft, nontender, nondistended, + BS MS: no deformity or atrophy  Skin: warm and dry, no rash Neuro:  Strength and sensation are intact Psych: euthymic mood, full affect   Recent Labs: 08/02/2019: Hemoglobin 14.7; Platelets 314 10/19/2019: ALT 17; BUN 13; Creatinine, Ser 1.15; Potassium 3.8; Sodium 142    Lipid Panel Lab Results  Component Value Date   CHOL 149 10/19/2019   HDL 45 10/19/2019   LDLCALC 91 10/19/2019   TRIG 65 10/19/2019      Wt Readings from Last 3 Encounters:  10/19/19 186 lb (84.4 kg)  08/02/19 190 lb (86.2 kg)  05/10/19 190 lb (86.2 kg)  ASSESSMENT AND PLAN:  Chest pain with moderate risk for cardiac etiology - Plan: EKG 12-Lead Atypical chest pain Possibly exacerbated by low potassium, reproducible No further cardiac work-up needed CT scan images reviewed with no aortic or coronary calcification, seen him at lower risk  Essential hypertension - Plan: EKG 12-Lead He has not been taking HCTZ for 2 weeks Suggested by blood pressure cuff check pressures at home and call our office with numbers Better blood  pressure medications if needed would be amlodipine, losartan On HCTZ has low potassium, dehydration causing problems  Smoker Long discussion, recommended he try Chantix Coupon provided, he would think about it  Dizziness Possibly from dehydration, HCTZ We will stop the HCTZ, recommend he monitor blood pressure at home  Disposition:   F/U as needed   Total encounter time more than 45 minutes  Greater than 50% was spent in counseling and coordination of care with the patient    No orders of the defined types were placed in this encounter.    Signed, Esmond Plants, M.D., Ph.D. 11/21/2019  [place], [place]

## 2019-11-22 ENCOUNTER — Ambulatory Visit: Payer: BC Managed Care – PPO | Admitting: Cardiovascular Disease

## 2019-12-23 ENCOUNTER — Ambulatory Visit: Payer: Self-pay | Attending: Internal Medicine

## 2019-12-23 DIAGNOSIS — Z23 Encounter for immunization: Secondary | ICD-10-CM

## 2019-12-23 NOTE — Progress Notes (Signed)
   Covid-19 Vaccination Clinic  Name:  Ilyas Lipsitz    MRN: [id number] DOB: 03-07-1966  12/23/2019  Mr. Vert was observed post Covid-19 immunization for 15 minutes without incident. He was provided with Vaccine Information Sheet and instruction to access the V-Safe system.   Mr. [place] was instructed to call 911 with any severe reactions post vaccine: Marland Kitchen Difficulty breathing  . Swelling of face and throat  . A fast heartbeat  . A bad rash all over body  . Dizziness and weakness   Immunizations Administered    Name Date Dose VIS Date Route   Pfizer COVID-19 Vaccine 12/23/2019 11:31 AM 0.3 mL 09/02/2019 Intramuscular   Manufacturer: [employer], [employer]   Lot: [id number]   NDC: [address]-5

## 2020-01-17 ENCOUNTER — Ambulatory Visit: Payer: Self-pay | Attending: Internal Medicine

## 2020-01-17 DIAGNOSIS — Z23 Encounter for immunization: Secondary | ICD-10-CM

## 2020-01-17 NOTE — Progress Notes (Signed)
   Covid-19 Vaccination Clinic  Name:  Darren Gibson    MRN: [id number] DOB: [birth year]  01/17/2020  Darren Gibson was observed post Covid-19 immunization for 15 minutes without incident. He was provided with Vaccine Information Sheet and instruction to access the V-Safe system.   Darren Gibson was instructed to call 911 with any severe reactions post vaccine: Marland Kitchen Difficulty breathing  . Swelling of face and throat  . A fast heartbeat  . A bad rash all over body  . Dizziness and weakness   Immunizations Administered    Name Date Dose VIS Date Route   Pfizer COVID-19 Vaccine 01/17/2020  2:28 PM 0.3 mL 11/16/2018 Intramuscular   Manufacturer: [employer], [employer]   Lot: [id number]   NDC: [address]-8

## 2020-09-12 IMAGING — CR DG CHEST 2V
1 series · 2 of 2 positions shown · non-contrast
Comparison: 06/15/2018

CLINICAL DATA: Chest pain

EXAM:
CHEST - 2 VIEW

[Series 1: dg chest 2 view · 0.14mm/px · 2 of 2 slices shown]
[im 1/2]
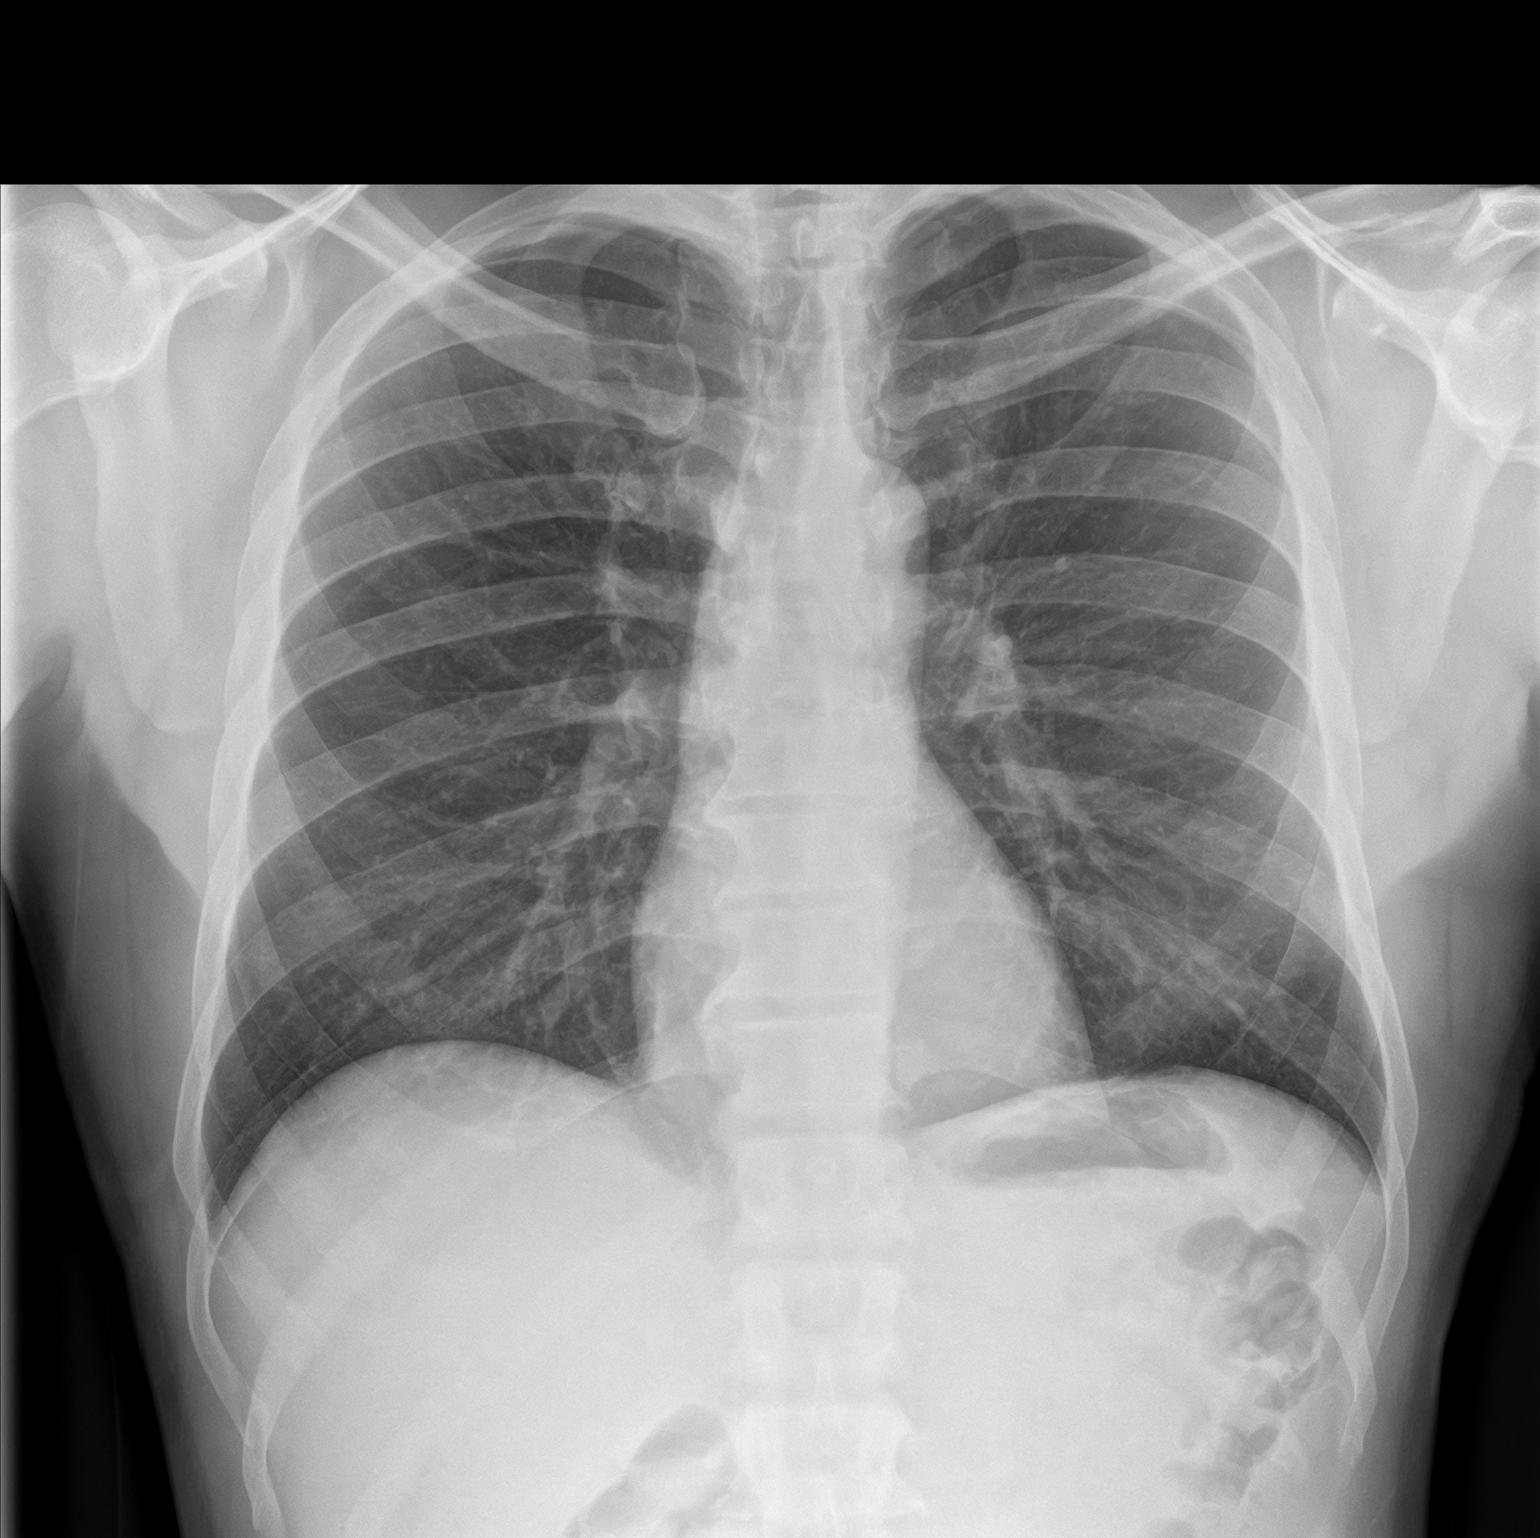
[im 2/2]
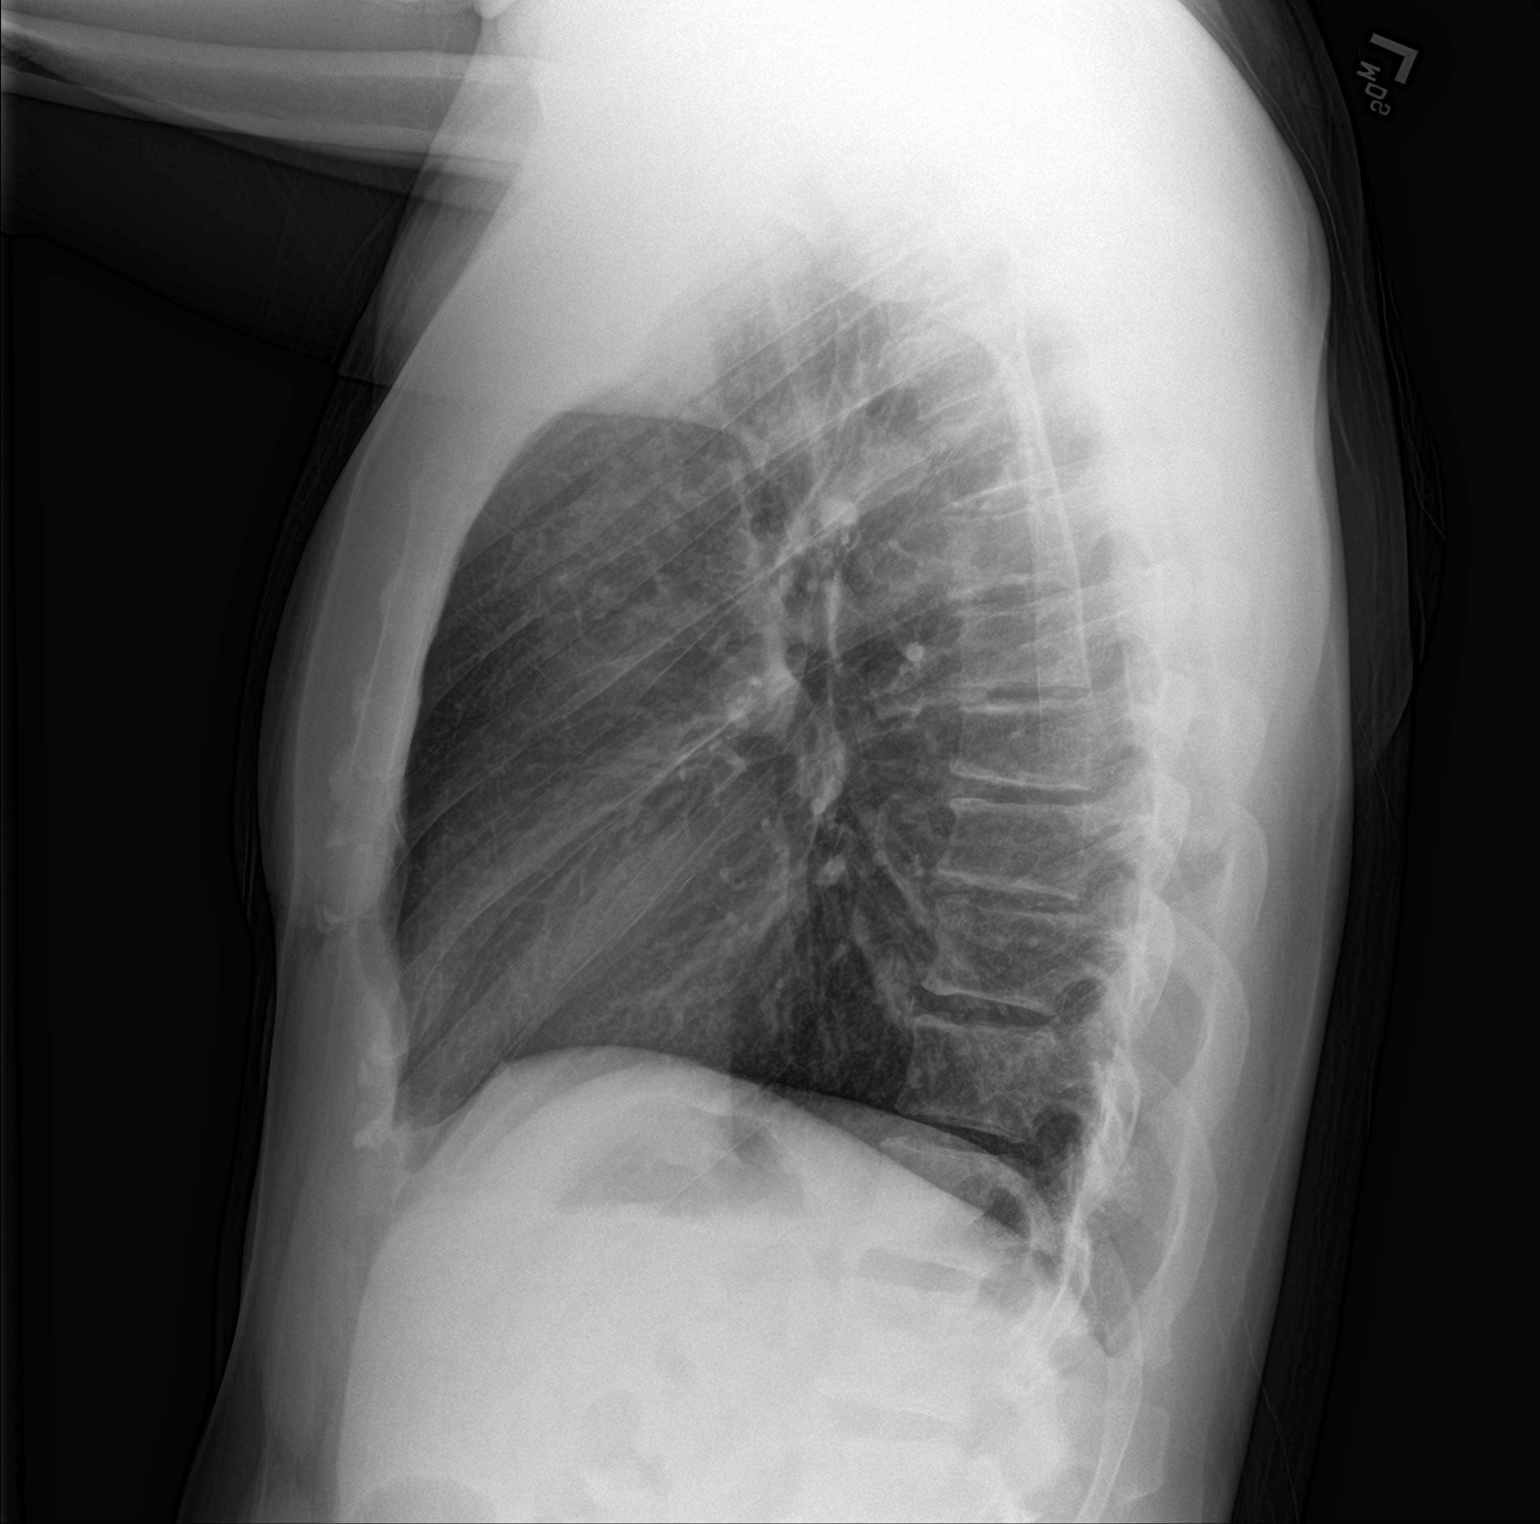

[2 of 2 positions shown; findings below may reference images not displayed]

FINDINGS: The heart size and mediastinal contours are within normal limits.
Both lungs are clear. The visualized skeletal structures are
unremarkable.
IMPRESSION: No active cardiopulmonary disease.

## 2021-06-28 ENCOUNTER — Encounter: Payer: Self-pay | Admitting: Emergency Medicine

## 2021-06-28 ENCOUNTER — Other Ambulatory Visit: Payer: Self-pay

## 2021-06-28 ENCOUNTER — Emergency Department
Admission: EM | Admit: 2021-06-28 | Discharge: 2021-06-28 | Disposition: A | Discharge status: Home / Self Care | Payer: Self-pay | Attending: Emergency Medicine | Admitting: Emergency Medicine

## 2021-06-28 DIAGNOSIS — F17210 Nicotine dependence, cigarettes, uncomplicated: Secondary | ICD-10-CM | POA: Insufficient documentation

## 2021-06-28 DIAGNOSIS — Z79899 Other long term (current) drug therapy: Secondary | ICD-10-CM | POA: Insufficient documentation

## 2021-06-28 DIAGNOSIS — J0110 Acute frontal sinusitis, unspecified: Secondary | ICD-10-CM

## 2021-06-28 DIAGNOSIS — I10 Essential (primary) hypertension: Secondary | ICD-10-CM | POA: Insufficient documentation

## 2021-06-28 MED ORDER — AMOXICILLIN-POT CLAVULANATE 875-125 MG PO TABS: 1.0000 | ORAL_TABLET | Freq: Two times a day (BID) | ORAL | 0 refills | Status: AC

## 2021-06-28 NOTE — ED Triage Notes (Signed)
Pt to ED via POV, pt states that for the past 2-3 weeks he has been getting headaches that last throughout the day on and off. Pt also states that he has been having palpitation and cold sweats during the night. Pt states that he has high blood pressure, pt is not on medication for BP. Pt reports family history of cardiac disease. Pt is in NAD.

## 2021-06-28 NOTE — ED Provider Notes (Signed)
[facility] Emergency Department Provider Note   ____________________________________________    I have reviewed the triage vital signs and the nursing notes.   HISTORY  Chief Complaint Headache     HPI Darren Gibson is a 55 y.o. male who presents with complaints of frontal headache that has been ongoing for over a week now.  He reports it is worse when he leans forward.  He describes it as the center of his forehead and top of his nose.  He has had occasional night sweats.  Denies myalgias, no cough, no fevers.  Has not take anything for this.  No neurodeficits.  Past Medical History:  Diagnosis Date   Hypertension     Patient Active Problem List   Diagnosis Date Noted   Chest pain with moderate risk for cardiac etiology 08/17/2018   Essential hypertension 08/17/2018   Smoker 08/17/2018   Dizziness 08/17/2018    History reviewed. No pertinent surgical history.  Prior to Admission medications   Medication Sig Start Date End Date Taking? Authorizing Provider  amoxicillin-clavulanate (AUGMENTIN) 875-125 MG tablet Take 1 tablet by mouth 2 (two) times daily for 7 days. 06/28/21 07/05/21 Yes Jene Every, MD  losartan (COZAAR) 25 MG tablet Take 1 tablet (25 mg total) by mouth daily. 10/19/19 01/17/20  Alver Sorrow, NP     Allergies Bee venom, Codeine, and Percocet [oxycodone-acetaminophen]  No family history on file.  Social History Social History   Tobacco Use   Smoking status: Every Day    Packs/day: 1.00    Types: Cigarettes   Smokeless tobacco: Never  Substance Use Topics   Alcohol use: No   Drug use: Yes    Types: Marijuana    Review of Systems  Constitutional: No fever/chills  ENT: No sore throat, mild congestion   Gastrointestinal: No abdominal pain.  No nausea, no vomiting.    Musculoskeletal: Negative for back pain. Skin: Negative for rash. Neurological: As  above    ____________________________________________   PHYSICAL EXAM:  VITAL SIGNS: ED Triage Vitals  Enc Vitals Group     BP 06/28/21 0858 (!) 175/98     Pulse Rate 06/28/21 0858 98     Resp 06/28/21 0858 18     Temp 06/28/21 0858 98.4 F (36.9 C)     Temp Source 06/28/21 0858 Oral     SpO2 06/28/21 0858 98 %     Weight 06/28/21 0900 89.8 kg (198 lb)     Height 06/28/21 0900 1.829 m (6')     Head Circumference --      Peak Flow --      Pain Score 06/28/21 0900 0     Pain Loc --      Pain Edu? --      Excl. in GC? --      Constitutional: Alert and oriented. No acute distress. Pleasant and interactive Eyes: Conjunctivae are normal.  Head: Atraumatic.  Mild tenderness over the frontal sinuses Nose: No congestion/rhinnorhea. Mouth/Throat: Mucous membranes are moist.   Cardiovascular: Normal rate, regular rhythm.  Respiratory: Normal respiratory effort.  No retractions.  Neurologic:  Normal speech and language. No gross focal neurologic deficits are appreciated.   Skin:  Skin is warm, dry and intact. No rash noted.   ____________________________________________   LABS (all labs ordered are listed, but only abnormal results are displayed)  Labs Reviewed - No data to display ____________________________________________  EKG   ____________________________________________  RADIOLOGY  None ____________________________________________   PROCEDURES  Procedure(s) performed: No  Procedures   Critical Care performed: No ____________________________________________   INITIAL IMPRESSION / ASSESSMENT AND PLAN / ED COURSE  Pertinent labs & imaging results that were available during my care of the patient were reviewed by me and considered in my medical decision making (see chart for details).   Patient well-appearing and in no acute distress, noted to have elevated blood pressure, encouraged him to get back on his HCTZ which he stopped taking.  Symptoms  suspicious for sinusitis given tenderness over the sinuses and his description of worsening discomfort with leaning forward.  We will start the patient on Augmentin, have recommended return precautions if no improvement for further evaluation   ____________________________________________   FINAL CLINICAL IMPRESSION(S) / ED DIAGNOSES  Final diagnoses:  Acute frontal sinusitis, recurrence not specified      NEW MEDICATIONS STARTED DURING THIS VISIT:  Discharge Medication List as of 06/28/2021  9:24 AM     START taking these medications   Details  amoxicillin-clavulanate (AUGMENTIN) 875-125 MG tablet Take 1 tablet by mouth 2 (two) times daily for 7 days., Starting Fri 06/28/2021, Until Fri 07/05/2021, Normal         Note:  This document was prepared using Dragon voice recognition software and may include unintentional dictation errors.    Jene Every, MD 06/28/21 1056
